# Patient Record
Sex: Male | Born: 1965 | Race: Black or African American | Hispanic: No | Smoking: Never smoker
Health system: Southern US, Community
[De-identification: ages and names within clinical notes are randomized; demographics above are authoritative.]

## PROBLEM LIST (undated history)

## (undated) DIAGNOSIS — F4310 Post-traumatic stress disorder, unspecified: Secondary | ICD-10-CM

## (undated) DIAGNOSIS — I10 Essential (primary) hypertension: Secondary | ICD-10-CM

## (undated) DIAGNOSIS — G43909 Migraine, unspecified, not intractable, without status migrainosus: Secondary | ICD-10-CM

## (undated) DIAGNOSIS — G8929 Other chronic pain: Secondary | ICD-10-CM

## (undated) DIAGNOSIS — M549 Dorsalgia, unspecified: Secondary | ICD-10-CM

## (undated) HISTORY — PX: BACK SURGERY: SHX140

---

## 2009-05-22 ENCOUNTER — Ambulatory Visit: Payer: Self-pay | Admitting: Internal Medicine

## 2009-05-22 DIAGNOSIS — M545 Low back pain, unspecified: Secondary | ICD-10-CM | POA: Insufficient documentation

## 2009-05-22 DIAGNOSIS — R519 Headache, unspecified: Secondary | ICD-10-CM | POA: Insufficient documentation

## 2009-05-22 DIAGNOSIS — F329 Major depressive disorder, single episode, unspecified: Secondary | ICD-10-CM | POA: Insufficient documentation

## 2009-05-22 DIAGNOSIS — Z8719 Personal history of other diseases of the digestive system: Secondary | ICD-10-CM | POA: Insufficient documentation

## 2009-05-22 DIAGNOSIS — I10 Essential (primary) hypertension: Secondary | ICD-10-CM | POA: Insufficient documentation

## 2009-05-22 DIAGNOSIS — F3289 Other specified depressive episodes: Secondary | ICD-10-CM | POA: Insufficient documentation

## 2009-05-22 DIAGNOSIS — R51 Headache: Secondary | ICD-10-CM | POA: Insufficient documentation

## 2009-05-22 LAB — CONVERTED CEMR LAB
Basophils Absolute: 0 10*3/uL (ref 0.0–0.1)
Basophils Relative: 0.2 % (ref 0.0–3.0)
Eosinophils Absolute: 0.2 10*3/uL (ref 0.0–0.7)
Eosinophils Relative: 3.9 % (ref 0.0–5.0)
HCT: 41.7 % (ref 39.0–52.0)
Hemoglobin: 13.6 g/dL (ref 13.0–17.0)
Lymphocytes Relative: 48.7 % — ABNORMAL HIGH (ref 12.0–46.0)
Lymphs Abs: 2.1 10*3/uL (ref 0.7–4.0)
MCHC: 32.6 g/dL (ref 30.0–36.0)
MCV: 94 fL (ref 78.0–100.0)
Monocytes Absolute: 0.4 10*3/uL (ref 0.1–1.0)
Monocytes Relative: 10.4 % (ref 3.0–12.0)
Neutro Abs: 1.5 10*3/uL (ref 1.4–7.7)
Neutrophils Relative %: 36.8 % — ABNORMAL LOW (ref 43.0–77.0)
Platelets: 188 10*3/uL (ref 150.0–400.0)
RBC: 4.43 M/uL (ref 4.22–5.81)
RDW: 12.7 % (ref 11.5–14.6)
WBC: 4.2 10*3/uL — ABNORMAL LOW (ref 4.5–10.5)

## 2009-05-25 ENCOUNTER — Encounter: Payer: Self-pay | Admitting: Internal Medicine

## 2009-05-25 ENCOUNTER — Telehealth: Payer: Self-pay | Admitting: Internal Medicine

## 2009-05-26 ENCOUNTER — Encounter (INDEPENDENT_AMBULATORY_CARE_PROVIDER_SITE_OTHER): Payer: Self-pay | Admitting: *Deleted

## 2009-05-27 ENCOUNTER — Telehealth (INDEPENDENT_AMBULATORY_CARE_PROVIDER_SITE_OTHER): Payer: Self-pay | Admitting: *Deleted

## 2009-05-28 ENCOUNTER — Encounter: Payer: Self-pay | Admitting: Physician Assistant

## 2009-05-28 ENCOUNTER — Ambulatory Visit: Payer: Self-pay | Admitting: Gastroenterology

## 2009-05-28 DIAGNOSIS — M1990 Unspecified osteoarthritis, unspecified site: Secondary | ICD-10-CM | POA: Insufficient documentation

## 2009-05-28 DIAGNOSIS — K625 Hemorrhage of anus and rectum: Secondary | ICD-10-CM | POA: Insufficient documentation

## 2009-05-29 ENCOUNTER — Ambulatory Visit: Payer: Self-pay | Admitting: Gastroenterology

## 2009-05-29 LAB — CONVERTED CEMR LAB
ALT: 33 units/L (ref 0–53)
AST: 29 units/L (ref 0–37)
Albumin: 4.4 g/dL (ref 3.5–5.2)
Alkaline Phosphatase: 44 units/L (ref 39–117)
BUN: 6 mg/dL (ref 6–23)
Basophils Absolute: 0 10*3/uL (ref 0.0–0.1)
Basophils Relative: 0.1 % (ref 0.0–3.0)
CO2: 31 meq/L (ref 19–32)
Calcium: 9.2 mg/dL (ref 8.4–10.5)
Chloride: 102 meq/L (ref 96–112)
Creatinine, Ser: 1 mg/dL (ref 0.4–1.5)
Eosinophils Absolute: 0.2 10*3/uL (ref 0.0–0.7)
Eosinophils Relative: 4.3 % (ref 0.0–5.0)
GFR calc non Af Amer: 103.96 mL/min (ref >60)
Glucose, Bld: 93 mg/dL (ref 70–99)
HCT: 42.1 % (ref 39.0–52.0)
Hemoglobin: 14.2 g/dL (ref 13.0–17.0)
Lymphocytes Relative: 42.3 % (ref 12.0–46.0)
Lymphs Abs: 2.1 10*3/uL (ref 0.7–4.0)
MCHC: 33.7 g/dL (ref 30.0–36.0)
MCV: 90.7 fL (ref 78.0–100.0)
Monocytes Absolute: 0.5 10*3/uL (ref 0.1–1.0)
Monocytes Relative: 9.6 % (ref 3.0–12.0)
Neutro Abs: 2.2 10*3/uL (ref 1.4–7.7)
Neutrophils Relative %: 43.7 % (ref 43.0–77.0)
Platelets: 179 10*3/uL (ref 150.0–400.0)
Potassium: 4.1 meq/L (ref 3.5–5.1)
RBC: 4.64 M/uL (ref 4.22–5.81)
RDW: 12.1 % (ref 11.5–14.6)
Sodium: 138 meq/L (ref 135–145)
Total Bilirubin: 1 mg/dL (ref 0.3–1.2)
Total Protein: 7.1 g/dL (ref 6.0–8.3)
WBC: 5 10*3/uL (ref 4.5–10.5)

## 2009-06-01 ENCOUNTER — Telehealth: Payer: Self-pay | Admitting: Gastroenterology

## 2009-06-03 ENCOUNTER — Encounter: Payer: Self-pay | Admitting: Gastroenterology

## 2009-08-18 ENCOUNTER — Ambulatory Visit: Payer: Self-pay | Admitting: Internal Medicine

## 2009-08-18 ENCOUNTER — Telehealth: Payer: Self-pay | Admitting: Gastroenterology

## 2009-08-18 ENCOUNTER — Ambulatory Visit: Payer: Self-pay | Admitting: Physician Assistant

## 2009-08-18 DIAGNOSIS — K648 Other hemorrhoids: Secondary | ICD-10-CM | POA: Insufficient documentation

## 2009-08-18 DIAGNOSIS — Z860100 Personal history of colon polyps, unspecified: Secondary | ICD-10-CM | POA: Insufficient documentation

## 2009-08-18 DIAGNOSIS — K921 Melena: Secondary | ICD-10-CM | POA: Insufficient documentation

## 2009-08-18 LAB — CONVERTED CEMR LAB
Basophils Absolute: 0 10*3/uL (ref 0.0–0.1)
Basophils Relative: 0.7 % (ref 0.0–3.0)
Eosinophils Absolute: 0.1 10*3/uL (ref 0.0–0.7)
Eosinophils Relative: 2.4 % (ref 0.0–5.0)
HCT: 41.3 % (ref 39.0–52.0)
Hemoglobin: 14.2 g/dL (ref 13.0–17.0)
Lymphocytes Relative: 37.2 % (ref 12.0–46.0)
Lymphs Abs: 2 10*3/uL (ref 0.7–4.0)
MCHC: 34.3 g/dL (ref 30.0–36.0)
MCV: 91 fL (ref 78.0–100.0)
Monocytes Absolute: 0.5 10*3/uL (ref 0.1–1.0)
Monocytes Relative: 10 % (ref 3.0–12.0)
Neutro Abs: 2.7 10*3/uL (ref 1.4–7.7)
Neutrophils Relative %: 49.7 % (ref 43.0–77.0)
Platelets: 207 10*3/uL (ref 150.0–400.0)
RBC: 4.54 M/uL (ref 4.22–5.81)
RDW: 13.3 % (ref 11.5–14.6)
WBC: 5.5 10*3/uL (ref 4.5–10.5)

## 2009-08-20 ENCOUNTER — Telehealth: Payer: Self-pay | Admitting: Internal Medicine

## 2010-01-19 ENCOUNTER — Telehealth (INDEPENDENT_AMBULATORY_CARE_PROVIDER_SITE_OTHER): Payer: Self-pay | Admitting: *Deleted

## 2010-02-26 ENCOUNTER — Emergency Department (HOSPITAL_COMMUNITY)
Admission: EM | Admit: 2010-02-26 | Discharge: 2010-02-26 | Payer: Self-pay | Source: Home / Self Care | Admitting: Emergency Medicine

## 2010-05-18 NOTE — Progress Notes (Signed)
Summary: triage  Phone Note From Other Clinic Call back at [phone]   Caller: Huntley Dec from Dr Jacinto Halim office Call For: Dr.Jacobs Reason for Call: Schedule Patient Appt Summary of Call: Dr Derrell Lolling would like this patient seen before Tues do to worsening rectal bleeding with clots and unknown source. Initial call taken by: Tawni Levy,  May 27, 2009 3:00 PM  Follow-up for Phone Call        pt scheduled with Amy for 05/28/09.  Huntley Dec with Dr Derrell Lolling will make pt aware. Follow-up by: Chales Abrahams CMA Duncan Dull),  May 27, 2009 3:39 PM

## 2010-05-18 NOTE — Progress Notes (Signed)
Summary: rectal bleeding.  Phone Note Call from Patient   Caller: Patient Call For: Gordy Savers  MD Summary of Call: [phone] Bleeding has increased to a flow x 24 hours when he is moving bowels.  Wants surgical referral today if possible. Initial call taken by: Lynann Beaver CMA,  May 25, 2009 8:10 AM  Follow-up for Phone Call        YES Follow-up by: Gordy Savers  MD,  May 25, 2009 8:32 AM  Additional Follow-up for Phone Call Additional follow up Details #1::        Spoke to Camelia Eng, and she is calling [facility], and will notify pt. Additional Follow-up by: Lynann Beaver CMA,  May 25, 2009 8:36 AM

## 2010-05-18 NOTE — Letter (Signed)
Summary: Out of Work  [facility]  [address] [place], [place] [address]   Phone: [phone]  Fax: [phone]    May 28, 2009   Employee:  MAGNUS CRESCENZO    To Whom It May Concern:   For Medical reasons, please excuse the above named employee from work for the following dates:  Start:   05-27-09   End:   Including 05-29-09 Patient having procedure on 05-29-09.  If you need additional information, please feel free to contact our office.         Sincerely,

## 2010-05-18 NOTE — Consult Note (Signed)
Summary: [facility]   Imported By: Maryln Gottron 06/10/2009 09:59:22  _____________________________________________________________________  External Attachment:    Type:   Image     Comment:   External Document

## 2010-05-18 NOTE — Progress Notes (Signed)
Summary: TRIAGE-Rectal Bleeding  Phone Note Call from Patient Call back at Home Phone [phone]   Caller: Patient Call For: Dr. Arlyce Dice Reason for Call: Talk to Nurse Summary of Call: pt would like for Dr. Arlyce Dice and his nurse to know that he is "still bleeding" rectally Initial call taken by: Vallarie Mare,  June 01, 2009 10:14 AM  Follow-up for Phone Call        Pt. had Colon on 05-29-09.  Continues with rectal bleeding, had 1 BM yesterday and passed blood, last night he only passed blood. Also c/o of a "raw"rectum. Pt. is in [place] wants script called to [phone].  1) Anusol HC supp. 1 pr BID x 7 days,sitz bath TID,Tucks pads to rectum TID,use baby wipes instead of toilet paper.  2) If symptoms become worse call back immediately or go to ER.   Follow-up by: Laureen Ochs LPN,  June 01, 2009 11:40 AM  Additional Follow-up for Phone Call Additional follow up Details #1::        ok.  He had a small rectal polyp removed.  Bleeding could be from that or hemorrhoids.  I doubt it's significant Additional Follow-up by: Louis Meckel MD,  June 02, 2009 8:35 AM

## 2010-05-18 NOTE — Progress Notes (Signed)
Summary: hemorrhoid pain & blood clots & dripping with gas pass  Phone Note Call from Patient Call back at [facility] Phone [phone]   Summary of Call: Still having same blood clots in stool.  Hemorrhoid injection by Dr. Derrell Lolling.  Don't see Dr. Gerilyn Pilgrim until 2-15 consult.   Not better.  Still have pain & blood clots. Drip a little blood when he passes gas.  [facility]  [facility].   Initial call taken by: Rudy Jew, RN,  May 27, 2009 10:26 AM  Follow-up for Phone Call        generic vicodin 5/500  #50 one every 6 hours for pain; f/u Dr Derrell Lolling Follow-up by: Gordy Savers  MD,  May 27, 2009 1:10 PM  Additional Follow-up for Phone Call Additional follow up Details #1::        Phone Call Completed Additional Follow-up by: Rudy Jew, RN,  May 27, 2009 1:27 PM    New/Updated Medications: VICODIN 5-500 MG TABS (HYDROCODONE-ACETAMINOPHEN) One every 6 hours for pain Prescriptions: VICODIN 5-500 MG TABS (HYDROCODONE-ACETAMINOPHEN) One every 6 hours for pain  #50 x 0   Entered by:   Rudy Jew, RN   Authorized by:   Gordy Savers  MD   Signed by:   Rudy Jew, RN on 05/27/2009   Method used:   Telephoned to ...         RxID:   [id number]

## 2010-05-18 NOTE — Procedures (Signed)
Summary: Colonoscopy  Patient: Alex [name] Note: All result statuses are Final unless otherwise noted.  Tests: (1) Colonoscopy (COL)   COL Colonoscopy           DONE     [facility]     [address]     [place], [place]  [address]           COLONOSCOPY PROCEDURE REPORT           PATIENT:  [name], Alex  MR#:  [id number]     BIRTHDATE:  09-27-1965, 43 yrs. old  GENDER:  male           ENDOSCOPIST:  Barbette Hair. Arlyce Dice, MD     Referred by:           PROCEDURE DATE:  05/29/2009     PROCEDURE:  Colon with cold biopsy polypectomy     ASA CLASS:  Class I     INDICATIONS:  rectal bleeding           MEDICATIONS:   Fentanyl 100 mcg IV, Versed 10 mg IV, Benadryl 12.5     mg IV           DESCRIPTION OF PROCEDURE:   After the risks benefits and     alternatives of the procedure were thoroughly explained, informed     consent was obtained.  Digital rectal exam was performed and     revealed no abnormalities.   The LB CF-[id number] endoscope     was introduced through the anus and advanced to the cecum, which     was identified by both the appendix and ileocecal valve, without     limitations.  The quality of the prep was good, using MoviPrep.     The instrument was then slowly withdrawn as the colon was fully     examined.     <<PROCEDUREIMAGES>>           FINDINGS:  A sessile polyp was found in the rectum. It was 2 mm in     size. It was found 5 cm from the point of entry. The polyp was     removed using cold biopsy forceps (see image10).  Internal     hemorrhoids were found (see image12).  This was otherwise a normal     examination of the colon (see image1, image3, image5, image7, and     image9).   Retroflexed views in the rectum revealed no     abnormalities.    The scope was then withdrawn from the patient     and the procedure completed.           COMPLICATIONS:  None           ENDOSCOPIC IMPRESSION:     1) 2 mm sessile polyp in the rectum     2) Internal  hemorrhoids     3) Otherwise normal examination           Limited rectal bleeding secondarhy to hemorrhoids           RECOMMENDATIONS:     1) If the polyp(s) removed today are proven to be adenomatous     (pre-cancerous) polyps, you will need a repeat colonoscopy in 5     years. Otherwise you should continue to follow colorectal cancer     screening guidelines for "routine risk" patients with colonoscopy     in 10 years.     2) anusol supp prn  REPEAT EXAM:   You will receive a letter from Dr. Arlyce Dice in 1-2     weeks, after reviewing the final pathology, with followup     recommendations.           ______________________________     Barbette Hair Arlyce Dice, MD           CC:  Claud Kelp, MDPeter Lysle Dingwall, MD           n.     Rosalie Doctor:   Barbette Hair. Domonic Hiscox at 05/29/2009 02:34 PM           [name], [id number]  Note: An exclamation mark (!) indicates a result that was not dispersed into the flowsheet. Document Creation Date: 05/29/2009 2:34 PM _______________________________________________________________________  (1) Order result status: Final Collection or observation date-time: 05/29/2009 14:28 Requested date-time:  Receipt date-time:  Reported date-time:  Referring Physician:   Ordering Physician: Melvia Heaps [phone]) Specimen Source:  Source: Launa Grill Order Number: [id number] Lab site:   Appended Document: Colonoscopy     Procedures Next Due Date:    Colonoscopy: 05/2019

## 2010-05-18 NOTE — Progress Notes (Signed)
Summary: TRIAGE--Black stool, weakness  Phone Note Call from Patient Call back at Home Phone [phone]   Caller: Patient Call For: Arlyce Dice Reason for Call: Talk to Nurse Summary of Call: Patient states that he has bright red blood in his stools and his stools are black, states that symptoms started today. Initial call taken by: Tawni Levy,  Aug 18, 2009 2:17 PM  Follow-up for Phone Call        Last OV 05-28-09 and Colon 05-29-09.  For 2-3 days pt. has seeingBRB in stools. This afternoon his BM was "Totally black"  Denies abd. pain, fever, n/v.  "I just feel like crap. I feel weak, different, I am not my bright self."  Pt. will have a STAT CBCD and see Amy [facility] PAC at 3pm today. Follow-up by: Laureen Ochs LPN,  Aug 18, 1025 2:26 PM

## 2010-05-18 NOTE — Letter (Signed)
Summary: Out of Work  [facility]  [address]   [place], [place] [address]   Phone: [phone]  Fax: [phone]    May 22, 2009   Employee:  NIRVAN LABAN    To Whom It May Concern:   For Medical reasons, please excuse the above named employee from work for the following dates:  Start:   05-22-2009  End:   05-25-2009  If you need additional information, please feel free to contact our office.         Sincerely,    Gordy Savers  MD

## 2010-05-18 NOTE — Progress Notes (Signed)
  Phone Note Other Incoming   Request: Send information Summary of Call: Request for records received from [facility]. Request forwarded to [employer].      Appended Document:  Request for records received from [facility]. Request forwarded to [employer].

## 2010-05-18 NOTE — Letter (Signed)
Summary: [facility] Instructions  [facility]  [address] [place], [place] [address]   Phone: [phone]  Fax: [phone]       Alex [name]    05-Dec-1965    MRN: [id number]        Procedure Day /Date:05-29-09     Arrival Time: 12:30 PM     Procedure Time: 1:30 PM     Location of Procedure:                    X    [facility] (4th Floor) _ PREPARATION FOR COLONOSCOPY WITH MOVIPREP    THE DAY BEFORE YOUR PROCEDURE         DATE: 05-28-09  DAY: Thursday  1.  Drink clear liquids the entire day-NO SOLID FOOD  2.  Do not drink anything colored red or purple.  Avoid juices with pulp.  No orange juice.  3.  Drink at least 64 oz. (8 glasses) of fluid/clear liquids during the day to prevent dehydration and help the prep work efficiently.  CLEAR LIQUIDS INCLUDE: Water Jello Ice Popsicles Tea (sugar ok, no milk/cream) Powdered fruit flavored drinks Coffee (sugar ok, no milk/cream) Gatorade Juice: apple, white grape, white cranberry  Lemonade Clear bullion, consomm, broth Carbonated beverages (any kind) Strained chicken noodle soup Hard Candy                             4.  In the morning, mix first dose of MoviPrep solution:    Empty 1 Pouch A and 1 Pouch B into the disposable container    Add lukewarm drinking water to the top line of the container. Mix to dissolve    Refrigerate (mixed solution should be used within 24 hrs)  5.  Begin drinking the prep at 5:00 p.m. The MoviPrep container is divided by 4 marks.   Every 15 minutes drink the solution down to the next mark (approximately 8 oz) until the full liter is complete.   6.  Follow completed prep with 16 oz of clear liquid of your choice (Nothing red or purple).  Continue to drink clear liquids until bedtime.  7.  Before going to bed, mix second dose of MoviPrep solution:    Empty 1 Pouch A and 1 Pouch B into the disposable container    Add lukewarm drinking water to the top line  of the container. Mix to dissolve    Refrigerate  THE DAY OF YOUR PROCEDURE      DATE: 05-29-09 DAY: Friday  Beginning at 8:30 AM  (5 hours before procedure):         1. Every 15 minutes, drink the solution down to the next mark (approx 8 oz) until the full liter is complete.  2. Follow completed prep with 16 oz. of clear liquid of your choice.    3. You may drink clear liquids until 9:30 AM (2 HOURS BEFORE PROCEDURE).   MEDICATION INSTRUCTIONS  Unless otherwise instructed, you should take regular prescription medications with a small sip of water   as early as possible the morning of your procedure.      OTHER INSTRUCTIONS  You will need a responsible adult at least 46 years of age to accompany you and drive you home.   This person must remain in the waiting room during your procedure.  Wear loose fitting clothing that is easily removed.  Leave jewelry and other valuables  at home.  However, you may wish to bring a book to read or  an iPod/MP3 player to listen to music as you wait for your procedure to start.  Remove all body piercing jewelry and leave at home.  Total time from sign-in until discharge is approximately 2-3 hours.  You should go home directly after your procedure and rest.  You can resume normal activities the  day after your procedure.  The day of your procedure you should not:   Drive   Make legal decisions   Operate machinery   Drink alcohol   Return to work  You will receive specific instructions about eating, activities and medications before you leave.    The above instructions have been reviewed and explained to me by   _______________________    I fully understand and can verbalize these instructions _____________________________ Date _________

## 2010-05-18 NOTE — Assessment & Plan Note (Signed)
Summary: BRAND NEW PT/TO ESTABLISH/CJR   Vital Signs:  Patient profile:   45 year old male Weight:      174 pounds Temp:     98.8 degrees F oral Pulse rate:   64 / minute BP sitting:   110 / 80  (right arm) Cuff size:   regular  Vitals Entered By: Raechel Ache, RN (May 22, 2009 1:31 PM) CC: New pt - bloos in stool x 2 mos , with every stool last 2 weeks   CC:  New pt - bloos in stool x 2 mos  and with every stool last 2 weeks.  History of Present Illness: 45 year old patient who is in today to establish with our practice.  In the past two months.  He has had intermittent bright red rectal bleeding.  For the past week or two.  This has intensified.  He was seen in the ED two days ago and apparently hemoglobin was fairly normal and clinical exam was nonrevealing.  Denies any rectal pain with bowel movements themselves have been normal.  Stool is well formed and normal in appearance.  No personal or family history of inflammatory bowel disease.  Preventive Screening-Counseling & Management  Alcohol-Tobacco     Smoking Status: never  Caffeine-Diet-Exercise     Does Patient Exercise: yes  Allergies (verified): No Known Drug Allergies  Past History:  Past Medical History: Depression/PTSD Headache Hypertension Low back pain- L1-2, and 3.  Fracture 1987 bright red rectal bleeding  Past Surgical History: Lumbar laminectomy in 1999  Family History: Reviewed history and no changes required. father's health unknown mother history of hypertension, and depression paternal grandfather history of throat cancer positive for diabetes and lupus  Social History: Reviewed history and no changes required. medical discharge from the [employer]	[place], 1987 Never Smoked Alcohol use-yes Regular exercise-yes Smoking Status:  never Does Patient Exercise:  yes  Review of Systems       The patient complains of anorexia, hematochezia, and depression.  The patient denies fever,  weight loss, weight gain, vision loss, decreased hearing, hoarseness, chest pain, syncope, dyspnea on exertion, peripheral edema, prolonged cough, headaches, hemoptysis, abdominal pain, melena, severe indigestion/heartburn, hematuria, incontinence, genital sores, muscle weakness, suspicious skin lesions, transient blindness, difficulty walking, unusual weight change, abnormal bleeding, enlarged lymph nodes, angioedema, breast masses, and testicular masses.    Physical Exam  General:  Well-developed,well-nourished,in no acute distress; alert,appropriate and cooperative throughout examination Head:  Normocephalic and atraumatic without obvious abnormalities. No apparent alopecia or balding. Eyes:  No corneal or conjunctival inflammation noted. EOMI. Perrla. Funduscopic exam benign, without hemorrhages, exudates or papilledema. Vision grossly normal. Ears:  External ear exam shows no significant lesions or deformities.  Otoscopic examination reveals clear canals, tympanic membranes are intact bilaterally without bulging, retraction, inflammation or discharge. Hearing is grossly normal bilaterally. Mouth:  Oral mucosa and oropharynx without lesions or exudates.  Teeth in good repair. Neck:  No deformities, masses, or tenderness noted. Chest Wall:  No deformities, masses, tenderness or gynecomastia noted. Breasts:  No masses or gynecomastia noted Lungs:  Normal respiratory effort, chest expands symmetrically. Lungs are clear to auscultation, no crackles or wheezes. Heart:  Normal rate and regular rhythm. S1 and S2 normal without gallop, murmur, click, rub or other extra sounds. Abdomen:  Bowel sounds positive,abdomen soft and non-tender without masses, organomegaly or hernias noted. Rectal:  No external abnormalities noted. Normal sphincter tone. No rectal masses or tenderness. stool light Amacher hematest negative  Genitalia:  Testes  bilaterally descended without nodularity, tenderness or masses. No  scrotal masses or lesions. No penis lesions or urethral discharge. Prostate:  Prostate gland firm and smooth, no enlargement, nodularity, tenderness, mass, asymmetry or induration. Msk:  No deformity or scoliosis noted of thoracic or lumbar spine.   Pulses:  R and L carotid,radial,femoral,dorsalis pedis and posterior tibial pulses are full and equal bilaterally Extremities:  No clubbing, cyanosis, edema, or deformity noted with normal full range of motion of all joints.   Neurologic:  No cranial nerve deficits noted. Station and gait are normal. Plantar reflexes are down-going bilaterally. DTRs are symmetrical throughout. Sensory, motor and coordinative functions appear intact. Skin:  Intact without suspicious lesions or rashes Cervical Nodes:  No lymphadenopathy noted Axillary Nodes:  No palpable lymphadenopathy Inguinal Nodes:  No significant adenopathy Psych:  Cognition and judgment appear intact. Alert and cooperative with normal attention span and concentration. No apparent delusions, illusions, hallucinations   Impression & Recommendations:  Problem # 1:  LOW BACK PAIN (ICD-724.2)  His updated medication list for this problem includes:    Ibuprofen 800 Mg Tabs (Ibuprofen) ..... Qd    Diclofenac Sodium 25 Mg Tbec (Diclofenac sodium) .[name]... Prn    Amrix 15 Mg Xr24h-cap (Cyclobenzaprine hcl) .[name]... Currently not using  Problem # 2:  HYPERTENSION (ICD-401.9)  His updated medication list for this problem includes:    Lisinopril 5 Mg Tabs (Lisinopril) ..... One daily  Problem # 3:  DEPRESSION (ICD-311)  His updated medication list for this problem includes:    Venlafaxine Hcl 150 Mg Xr24h-cap (Venlafaxine hcl) ..... Qd  Problem # 4:  RECTAL BLEEDING, HX OF (ICD-V12.79)  Orders: Venipuncture (45409) TLB-CBC Platelet - w/Differential (85025-CBCD) Surgical Referral (Surgery)  Complete Medication List: 1)  Ibuprofen 800 Mg Tabs (Ibuprofen) .... Qd 2)  Sumatriptan Succinate 6  Mg/0.29ml Kit (Sumatriptan succinate) .... As needed 3)  Venlafaxine Hcl 150 Mg Xr24h-cap (Venlafaxine hcl) .... Qd 4)  Ambien 5 Mg Tabs (Zolpidem tartrate) .... Mon - fri 5)  Diclofenac Sodium 25 Mg Tbec (Diclofenac sodium) .... Prn 6)  Amrix 15 Mg Xr24h-cap (Cyclobenzaprine hcl) .... Currently not using 7)  Lisinopril 5 Mg Tabs (Lisinopril) .... One daily  Patient Instructions: 1)  general surgical follow-up as scheduled 2)  Please schedule a follow-up appointment in 3 months. 3)  It is important that you exercise regularly at least 20 minutes 5 times a week. If you develop chest pain, have severe difficulty breathing, or feel very tired , stop exercising immediately and seek medical attention. Prescriptions: LISINOPRIL 5 MG TABS (LISINOPRIL) one daily  #90 x 4   Entered and Authorized by:   Gordy Savers  MD   Signed by:   Gordy Savers  MD on 05/22/2009   Method used:   Print then Give to Patient   RxID:   [id number] AMBIEN 5 MG TABS (ZOLPIDEM TARTRATE) mon - fri  #50 x 2   Entered and Authorized by:   Gordy Savers  MD   Signed by:   Gordy Savers  MD on 05/22/2009   Method used:   Print then Give to Patient   RxID:   [id number] VENLAFAXINE HCL 150 MG XR24H-CAP (VENLAFAXINE HCL) qd  #180 x 6   Entered and Authorized by:   Gordy Savers  MD   Signed by:   Gordy Savers  MD on 05/22/2009   Method used:   Print then Give to Patient   RxID:   [id number]  SUMATRIPTAN SUCCINATE 6 MG/0.5ML KIT (SUMATRIPTAN SUCCINATE) as needed  #3 x 3   Entered and Authorized by:   Gordy Savers  MD   Signed by:   Gordy Savers  MD on 05/22/2009   Method used:   Print then Give to Patient   RxID:   [id number]

## 2010-05-18 NOTE — Letter (Signed)
Summary: Out of Work  [facility]  [address]   [place], [place] [address]   Phone: [phone]  Fax: [phone]    May 22, 2009   Employee:  Alex [name]    To Whom It May Concern:   For Medical reasons, please excuse the above named employee from work for the following dates:  Start:   05-22-2009  End:   05-25-2009  If you need additional information, please feel free to contact our office.         Sincerely,    [name] [name]  MD 

## 2010-05-18 NOTE — Progress Notes (Signed)
Summary: [facility]  called pt - [facility] - forgot appt today - transffered to scheduling to r/s   [facility]

## 2010-05-18 NOTE — Letter (Signed)
Summary: Patient Notice-Hyperplastic Polyps  [facility]  [address] [place], [place] [address]   Phone: [phone]  Fax: [phone]        June 03, 2009 MRN: [id number]    Alex [name] [address] [place], [place]  [address]    Dear [name],  I am pleased to inform you that the colon polyp(s) removed during your recent colonoscopy was (were) found to be hyperplastic.  These types of polyps are NOT pre-cancerous.  It is therefore my recommendation that you have a repeat colonoscopy examination in 10_ years for routine colorectal cancer screening.  Should you develop new or worsening symptoms of abdominal pain, bowel habit changes or bleeding from the rectum or bowels, please schedule an evaluation with either your primary care physician or with me.  Additional information/recommendations:  __No further action with gastroenterology is needed at this time.      Please follow-up with your primary care physician for your other      healthcare needs. __Please call [phone] to schedule a return visit to review      your situation.  __Please keep your follow-up visit as already scheduled.  _x_Continue treatment plan as outlined the day of your exam.  Please call us if you are having persistent problems or have questions about your condition that have not been fully answered at this time.  Sincerely,  Louis Meckel MD This letter has been electronically signed by your physician.  Appended Document: Patient Notice-Hyperplastic Polyps Letter mailed 2.17.11

## 2010-05-18 NOTE — Assessment & Plan Note (Signed)
Summary: Alex [name], Alex [name], Alex [name]   (DR.KAPLAN PT.)   Alex [name]   History of Present Illness Visit Type: Follow-up Visit Primary GI MD: Melvia Heaps MD [facility] Primary Provider: Eleonore Chiquito, MD Requesting Provider: na Chief Complaint: Patient having blood in his [name] for the past couple of days. He states that the [name] is Alex, he denies any abdominal pain but states that he has been under alot of stress the last week or so.  History of Present Illness:   PLEASANT 45 Y.O MALE KNOWN TO DR. KAPLAN. HE HAS HX OF INTERMITTENT RECTAL BLEEDING AND UNDERWENT COLONOSCOPY ON 211/11. THIS SHOWED A SMALL SESSILE RECTAL POLYP,AND INTERNAL HEMORRHOIDS.Marland KitchenHE HAS BEEN USING ANUSOL SUPP ,AND ANALPRAM AS NEEDED BUT STILL IS BOTHERED BY BRB OFF AND ON. HE COMES IN TODAY BECAUSE HE PASSED A Alex [name] X 2 TODAY. [name] ALSO HAD SOME DARK RED BLOOD ON IT. HE HAS NO C/O ABDOMINAL DISCOMFROT, RECTAL DISCOMFORT. HE HAS BEEN ON IBUPROFEN 8OO MG DAILY FOR YEARS, HAD NOT USED ANY PEPTO ETC. THE Alex [name] ALARMED HIM.   GI Review of Systems      Denies abdominal pain, acid reflux, belching, bloating, chest pain, dysphagia with liquids, dysphagia with solids, heartburn, loss of appetite, nausea, vomiting, and  weight loss.      Reports Alex tarry stools, hemorrhoids, and  rectal bleeding.     Denies anal fissure, change in bowel habit, constipation, diarrhea, diverticulosis, fecal incontinence, heme positive [name], irritable bowel syndrome, jaundice, light color [name], liver problems, and  rectal pain. Preventive Screening-Counseling & Management      Drug Use:  no.      Current Medications (verified): 1)  Ibuprofen 800 Mg Tabs (Ibuprofen) .... Take One By Mouth Once Daily 2)  Sumatriptan Succinate 6 Mg/0.73ml Kit (Sumatriptan Succinate) .... As Needed 3)  Venlafaxine Hcl 150 Mg Xr24h-Cap (Venlafaxine Hcl) .... Qd 4)  Ambien 5 Mg Tabs (Zolpidem Tartrate) .... Mon - Fri 5)  Diclofenac Sodium 25 Mg Tbec  (Diclofenac Sodium) .... Prn 6)  Lisinopril 5 Mg Tabs (Lisinopril) .... One Daily  Allergies (verified): No Known Drug Allergies  Past History:  Past Medical History: Depression/PTSD Headache Hypertension Low back pain- L1-2, and 3.  Fracture 1987 hyperplastic polyp INTERNAL HEMORRHOIDS  Past Surgical History: Reviewed history from 05/22/2009 and no changes required. Lumbar laminectomy in 1999  Family History: father's health unknown mother history of hypertension, and depression paternal grandfather history of throat cancer positive for diabetes and lupus: maternal grandmother No FH of Colon Cancer:  Social History: medical discharge from the [employer]	[place], 1987 Never Smoked Alcohol use-yes Regular exercise-yes Daily Caffeine Use Illicit Drug Use - no Drug Use:  no  Review of Systems       The patient complains of anxiety-new, arthritis/joint pain, back pain, change in vision, depression-new, Alex [name], headaches-new, muscle pains/cramps, and sleeping problems.  The patient denies allergy/sinus, anemia, blood in urine, breast changes/lumps, confusion, cough, coughing up blood, fainting, fever, hearing problems, heart murmur, heart rhythm changes, itching, menstrual pain, night sweats, nosebleeds, pregnancy symptoms, shortness of breath, skin rash, sore throat, swelling of feet/legs, swollen lymph glands, thirst - excessive , urination - excessive , urination changes/pain, urine leakage, vision changes, and voice change.         ROS OTHERWISE AS IN HPI  Vital Signs:  Patient profile:   45 year old male Height:      67 inches Weight:      175.6 pounds BMI:  27.60 Pulse rate:   70 / minute Pulse rhythm:   regular BP sitting:   138 / 80  (left arm) Cuff size:   regular  Vitals Entered By: Harlow Mares CMA Duncan Dull) (Aug 18, 2009 3:30 PM)  Physical Exam  General:  Well developed, well nourished, no acute distress. Head:  Normocephalic and atraumatic. Eyes:   PERRLA, no icterus. Lungs:  Clear throughout to auscultation. Heart:  Regular rate and rhythm; no murmurs, rubs,  or bruits. Abdomen:  SOFT, NONTENDER, NO MASS OR HSM,BS+ Rectal:  DARK Seth SCANT [name],HEME POSITIVE, NO EXTERNAL HEMORRHOIDS Extremities:  No clubbing, cyanosis, edema or deformities noted. Neurologic:  Alert and  oriented x4;  grossly normal neurologically. Psych:  Alert and cooperative. Normal mood and affect.   Impression & Recommendations:  Problem # 1:  BLOOD IN [name]-MELENA (ICD-578.1) Assessment New 44 YO MALE WITH KNOWN INTERMITTENT RECTAL BLEEDING FROM INTERNAL HEMORRHOIDS, NOW WITH C/O MELENA. HE HAS HEME POSITIVE [name] ON EXAM BUT NOT MELENA.  HE DOES HAVE INCREASED RISK FOR UPPER GI IRRITATION SECONDARY TO CHRONIC NSAID USE. LABS IN OFFICE TODAY SHOW NORMAL HGB OF 14.2.  REASSURANCE. WILL STOP IBUPROFEN START NEXIUM 40 MG DAILY X ONE MONTH(SAMPLES GIVEN) PT ADVISED TO CALL IF HE HAS ANY FURTHER Alex STOOLS -AND AT THAT POINT WOULD PURSUE EGD .HE WILL CALL us ON 5/5 WITH AN UPDATE.  Problem # 2:  PERSONAL HX COLONIC POLYPS (ICD-V12.72) Assessment: Comment Only HYPERPLASTIC. COLONOSCOPY 2/11  Problem # 3:  HEMORRHOIDS-INTERNAL (ICD-455.0) Assessment: Comment Only UNCHANGED  Patient Instructions: 1)  We have given you samples of Nexium. Take 1 capsule 30 min before breakfast. 2)  Call Pam on Thurs 08-20-09 at [phone] with an update. 3)  Any more Alex stools and you may need an Endoscopy. 4)  Copy sent to : Eleonore Chiquito, MD 5)  The medication list was reviewed and reconciled.  All changed / newly prescribed medications were explained.  A complete medication list was provided to the patient / caregiver.

## 2010-05-18 NOTE — Letter (Signed)
Summary: New Patient letter  [facility]  [address] [place], [place] [address]   Phone: [phone]  Fax: [phone]       05/26/2009 MRN: [id number]  Alex [name] [address] [place], [place]  [address]  Dear Mr. WIMER,  Welcome to the Gastroenterology Division at [facility].    You are scheduled to see Dr.  Christella Hartigan on 06-02-09 at 2pm on the 3rd floor at [facility], [address]  We ask that you try to arrive at our office 15 minutes prior to your appointment time to allow for check-in.  We would like you to complete the enclosed self-administered evaluation form prior to your visit and bring it with you on the day of your appointment.  We will review it with you.  Also, please bring a complete list of all your medications or, if you prefer, bring the medication bottles and we will list them.  Please bring your insurance card so that we may make a copy of it.  If your insurance requires a referral to see a specialist, please bring your referral form from your primary care physician.  Co-payments are due at the time of your visit and may be paid by cash, check or credit card.     Your office visit will consist of a consult with your physician (includes a physical exam), any laboratory testing he/she may order, scheduling of any necessary diagnostic testing (e.g. x-ray, ultrasound, CT-scan), and scheduling of a procedure (e.g. Endoscopy, Colonoscopy) if required.  Please allow enough time on your schedule to allow for any/all of these possibilities.    If you cannot keep your appointment, please call [phone] to cancel or reschedule prior to your appointment date.  This allows [place] the opportunity to schedule an appointment for another patient in need of care.  If you do not cancel or reschedule by 5 p.m. the business day prior to your appointment date, you will be charged a $50.00 late cancellation/no-show fee.    Thank you for choosing [facility] for your medical needs.  We appreciate the opportunity to care for you.  Please visit [place] at our website  to learn more about our practice.                     Sincerely,                                                             The Gastroenterology Division

## 2010-05-18 NOTE — Assessment & Plan Note (Signed)
Summary: rectal bleeding with clots/pl   History of Present Illness Visit Type: Initial Consult Primary GI MD: Melvia Heaps MD [facility] Primary Provider: Eleonore Chiquito, MD Requesting Provider: Claud Kelp, MD Chief Complaint: BRB with BM starting 2 months ago off and on bu more recently he has seen BRB with darker blood clots with BM's every day. Pt hasn't noticed a significant change in bowels besides his stool is more formed. No abd pain.  History of Present Illness:   45 YO MALE NEW TO GI TODAY REFERRED BY DR Alex [name] FOR COLON EVALUATION. HE WAS SEEN BBY DR Alex [name] EARLIER IN THE WEEK FOR RECTAL BLEEDING,PER PTS REPORT HAD INJECTION OF SMALL INTERNAL HEMORRHOID BUT DR. INGRAM WAS NOT CONVINCED THIS WAS THE SOURCE OF HIS BLEEDING. HE HAS BEEN HAVING SOME INTERMITTENT RECTAL BLEEDING OVER THE  PAST COUPLE MONTHS,BUT THE PAST 2 WEEKS HAS BEEN SEEING MORE BLOOD AND SOME CLOTS.HE HAS NO RECTAL DISCOMFORT PRESSURE ETC. HE HAS NOT HAD ANY ABDOMINAL PAIN.[name] APPETITE FAIR,WEIGHT DOWN 5 POUNDS THE PAST MONTH,NOT SLEEPING WELL. [name] HE C/O FATIGUE. HE IS NOW USING ANUSOL SUPP, AND IS DOING SITZ BATHS WITH EPSOM SALTS AND SAW LESS BLOOD TODAY.   GI Review of Systems      Denies abdominal pain, acid reflux, belching, bloating, chest pain, dysphagia with liquids, dysphagia with solids, heartburn, loss of appetite, nausea, vomiting, vomiting blood, weight loss, and  weight gain.      Reports hemorrhoids and  rectal bleeding.     Denies anal fissure, black tarry stools, change in bowel habit, constipation, diarrhea, diverticulosis, fecal incontinence, heme positive stool, irritable bowel syndrome, jaundice, light color stool, liver problems, and  rectal pain.      ------------------------------------------------------------------------------------------------------------------------------------------------------    Current Medications (verified): 1)  Ibuprofen 800 Mg Tabs (Ibuprofen) .... Qd 2)   Sumatriptan Succinate 6 Mg/0.26ml Kit (Sumatriptan Succinate) .... As Needed 3)  Venlafaxine Hcl 150 Mg Xr24h-Cap (Venlafaxine Hcl) .... Qd 4)  Ambien 5 Mg Tabs (Zolpidem Tartrate) .... Mon - Fri 5)  Diclofenac Sodium 25 Mg Tbec (Diclofenac Sodium) .... Prn 6)  Amrix 15 Mg Xr24h-Cap (Cyclobenzaprine Hcl) .... Currently Not Using 7)  Lisinopril 5 Mg Tabs (Lisinopril) .... One Daily 8)  Vicodin 5-500 Mg Tabs (Hydrocodone-Acetaminophen) .... One Every 6 Hours For Pain  Allergies (verified): No Known Drug Allergies  Past History:  Past Medical History: Depression/PTSD Headache Hypertension Low back pain- L1-2, and 3.  Fracture [id number]  Past Surgical History: Reviewed history from 05/22/2009 and no changes required. Lumbar laminectomy in 1999  Family History: Reviewed history from 05/22/2009 and no changes required. father's health unknown mother history of hypertension, and depression paternal grandfather history of throat cancer positive for diabetes and lupus  Social History: Reviewed history from 05/22/2009 and no changes required. medical discharge from the [employer]	[place], 1987 Never Smoked Alcohol use-yes Regular exercise-yes  Review of Systems       The patient complains of fatigue.  The patient denies allergy/sinus, anemia, anxiety-new, arthritis/joint pain, back pain, blood in urine, breast changes/lumps, change in vision, confusion, cough, coughing up blood, depression-new, fainting, fever, headaches-new, hearing problems, heart murmur, heart rhythm changes, itching, menstrual pain, muscle pains/cramps, night sweats, nosebleeds, pregnancy symptoms, shortness of breath, skin rash, sleeping problems, sore throat, swelling of feet/legs, swollen lymph glands, thirst - excessive , urination - excessive , urination changes/pain, urine leakage, vision changes, and voice change.         ROS OTHERWISE AS IN HPI  Vital Signs:  Patient  profile:   45 year old male Height:      67  inches Weight:      176 pounds BMI:     27.67 Pulse rate:   70 / minute Pulse rhythm:   regular BP sitting:   134 / 82  (left arm) Cuff size:   regular  Vitals Entered By: Christie Nottingham CMA Duncan Dull) (May 28, 2009 10:34 AM)  Physical Exam  General:  Well developed, well nourished, no acute distress. Head:  Normocephalic and atraumatic. Eyes:  PERRLA, no icterus. Lungs:  Clear throughout to auscultation. Heart:  Regular rate and rhythm; no murmurs, rubs,  or bruits. Abdomen:  SOFT, NONTENDER, NO MASS OR HSM,BS+ Rectal:  NOT DONE,HAD EXAM PER DR INGRAM EARLIER THIS WEEK. Extremities:  No clubbing, cyanosis, edema or deformities noted. Neurologic:  Alert and  oriented x4;  grossly normal neurologically. Psych:  Alert and cooperative. Normal mood and affect.   Impression & Recommendations:  Problem # 1:  RECTAL BLEEDING (ICD-569.3) Assessment New 45 YO MALE WITH BRB PER RECTUM  X 2-3 WEEKS,EXAM PER SURGEON DR INGRAM  EARLIER THIS WEEK WITH INJECTION OF SMALL INTERNAL HEMORRHOID,UNCLEAR IF HEMORRHOID IS SOURCE OF BLEEDING. R/O OCCULT LESION.  LABS TODAY AS BELOW CONTINUE SITZ BATHS AND ANUSOL HC SUPP.[facility] SCHEDULE FOR COLONOSCOPY WITH DR Arlyce Dice ,PROCEDURE DISCUSSED IN DETAIL WITH PT.  Problem # 2:  HYPERTENSION (ICD-401.9) Assessment: Comment Only  Problem # 3:  LOW BACK PAIN (ICD-724.2) Assessment: Comment Only CHRONIC  Problem # 4:  DEPRESSION (ICD-311)/PTSD Assessment: Comment Only  Other Orders: Colonoscopy (Colon) TLB-CMP (Comprehensive Metabolic Pnl) (80053-COMP) TLB-CBC Platelet - w/Differential (85025-CBCD) Prescriptions: MOVIPREP 100 GM  SOLR (PEG-KCL-NACL-NASULF-NA ASC-C) As per prep instructions.  #1 x 0   Entered by:   Lowry Ram NCMA   Authorized by:   Sammuel Cooper PA-c   Signed by:   Lowry Ram NCMA on 05/28/2009   Method used:   Electronically to        [facility] # [phone]* (retail)       [address]       [place], [place]   [address]       Ph: [phone]       Fax: [phone]   RxID:   [id number]

## 2010-05-18 NOTE — Letter (Signed)
Summary: Out of Work  Adult nurse at [employer]  [address]   [place], [place] [address]   Phone: [phone]  Fax: [phone]    May 22, 2009   Employee:  Alex [name]    To Whom It May Concern:   For Medical reasons, please excuse the above named employee from work for the following dates:  Start:    End:    If you need additional information, please feel free to contact our office.         Sincerely,    Gordy Savers  MD

## 2010-06-29 LAB — POCT I-STAT, CHEM 8
BUN: 11 mg/dL (ref 6–23)
Calcium, Ion: 1.18 mmol/L (ref 1.12–1.32)
Chloride: 107 mEq/L (ref 96–112)
Creatinine, Ser: 1.2 mg/dL (ref 0.4–1.5)
Glucose, Bld: 96 mg/dL (ref 70–99)
HCT: 44 % (ref 39.0–52.0)
Hemoglobin: 15 g/dL (ref 13.0–17.0)
Potassium: 3.8 mEq/L (ref 3.5–5.1)
Sodium: 141 mEq/L (ref 135–145)
TCO2: 26 mmol/L (ref 0–100)

## 2013-11-15 ENCOUNTER — Emergency Department (HOSPITAL_BASED_OUTPATIENT_CLINIC_OR_DEPARTMENT_OTHER)
Admission: EM | Admit: 2013-11-15 | Discharge: 2013-11-15 | Disposition: A | Discharge status: Home / Self Care | Payer: Medicare Other | Attending: Emergency Medicine | Admitting: Emergency Medicine

## 2013-11-15 ENCOUNTER — Encounter (HOSPITAL_BASED_OUTPATIENT_CLINIC_OR_DEPARTMENT_OTHER): Payer: Self-pay | Admitting: Emergency Medicine

## 2013-11-15 DIAGNOSIS — J3489 Other specified disorders of nose and nasal sinuses: Secondary | ICD-10-CM | POA: Insufficient documentation

## 2013-11-15 DIAGNOSIS — R509 Fever, unspecified: Secondary | ICD-10-CM | POA: Insufficient documentation

## 2013-11-15 DIAGNOSIS — F4310 Post-traumatic stress disorder, unspecified: Secondary | ICD-10-CM | POA: Diagnosis not present

## 2013-11-15 DIAGNOSIS — H65192 Other acute nonsuppurative otitis media, left ear: Secondary | ICD-10-CM

## 2013-11-15 DIAGNOSIS — H9209 Otalgia, unspecified ear: Secondary | ICD-10-CM | POA: Insufficient documentation

## 2013-11-15 DIAGNOSIS — R05 Cough: Secondary | ICD-10-CM | POA: Insufficient documentation

## 2013-11-15 DIAGNOSIS — J029 Acute pharyngitis, unspecified: Secondary | ICD-10-CM | POA: Diagnosis not present

## 2013-11-15 DIAGNOSIS — R059 Cough, unspecified: Secondary | ICD-10-CM | POA: Insufficient documentation

## 2013-11-15 DIAGNOSIS — N390 Urinary tract infection, site not specified: Secondary | ICD-10-CM | POA: Diagnosis not present

## 2013-11-15 DIAGNOSIS — I10 Essential (primary) hypertension: Secondary | ICD-10-CM | POA: Diagnosis not present

## 2013-11-15 DIAGNOSIS — H65199 Other acute nonsuppurative otitis media, unspecified ear: Secondary | ICD-10-CM | POA: Insufficient documentation

## 2013-11-15 DIAGNOSIS — IMO0002 Reserved for concepts with insufficient information to code with codable children: Secondary | ICD-10-CM | POA: Diagnosis not present

## 2013-11-15 DIAGNOSIS — Z792 Long term (current) use of antibiotics: Secondary | ICD-10-CM | POA: Diagnosis not present

## 2013-11-15 DIAGNOSIS — J069 Acute upper respiratory infection, unspecified: Secondary | ICD-10-CM

## 2013-11-15 DIAGNOSIS — Z79899 Other long term (current) drug therapy: Secondary | ICD-10-CM | POA: Diagnosis not present

## 2013-11-15 HISTORY — DX: Post-traumatic stress disorder, unspecified: F43.10

## 2013-11-15 HISTORY — DX: Essential (primary) hypertension: I10

## 2013-11-15 MED ORDER — FLUTICASONE PROPIONATE 50 MCG/ACT NA SUSP: 2.0000 | Freq: Every day | NASAL | Status: DC

## 2013-11-15 MED ORDER — AMOXICILLIN 500 MG PO CAPS: 500.0000 mg | ORAL_CAPSULE | Freq: Three times a day (TID) | ORAL | Status: DC

## 2013-11-15 NOTE — ED Notes (Signed)
C/o left ear pain and congestion x 10 days

## 2013-11-15 NOTE — ED Provider Notes (Signed)
CSN: [id number]     Arrival date & time 11/15/13  1901 History   First MD Initiated Contact with Patient 11/15/13 1903     Chief Complaint  Patient presents with  . Sore Throat  . Nasal Congestion  . Otalgia     (Consider location/radiation/quality/duration/timing/severity/associated sxs/prior Treatment) HPI Comments: Patient is a 48 year old male who presents to the emergency department complaining of sore throat, bilateral ear pain, nasal congestion and dry cough x10 days. States his ear pain as throbbing and he has a full sensation of his ears. Admits to subjective fevers. He has been taking aspirin with minimal relief. He also used over-the-counter Robitussin with minimal relief. He reports his daughter who attends daycare was recently sick with thrush and an upper respiratory infection. Denies nausea, vomiting or diarrhea.  Patient is a 48 y.o. male presenting with pharyngitis and ear pain. The history is provided by the patient.  Sore Throat Associated symptoms include congestion, coughing, a fever and a sore throat.  Otalgia Associated symptoms: congestion, cough, fever and sore throat     Past Medical History  Diagnosis Date  . Hypertension   . PTSD (post-traumatic stress disorder)    Past Surgical History  Procedure Laterality Date  . Back surgery     History reviewed. No pertinent family history. History  Substance Use Topics  . Smoking status: Never Smoker   . Smokeless tobacco: Not on file  . Alcohol Use: Yes    Review of Systems  Constitutional: Positive for fever and appetite change.  HENT: Positive for congestion, ear pain and sore throat.   Respiratory: Positive for cough.   All other systems reviewed and are negative.     Allergies  Review of patient's allergies indicates no known allergies.  Home Medications   Prior to Admission medications   Medication Sig Start Date End Date Taking? Authorizing Provider  hydrochlorothiazide (HYDRODIURIL) 12.5  MG tablet Take 12.5 mg by mouth daily.   Yes Historical Provider, MD  lisinopril (PRINIVIL,ZESTRIL) 2.5 MG tablet Take 2.5 mg by mouth daily.   Yes Historical Provider, MD  venlafaxine (EFFEXOR) 100 MG tablet Take 250 mg by mouth once.   Yes Historical Provider, MD  amoxicillin (AMOXIL) 500 MG capsule Take 1 capsule (500 mg total) by mouth 3 (three) times daily. 11/15/13   Trevor Mace, PA-C  fluticasone (FLONASE) 50 MCG/ACT nasal spray Place 2 sprays into both nostrils daily. 11/15/13   Trevor Mace, PA-C   BP 142/82  Pulse 77  Temp(Src) 98.2 F (36.8 C) (Oral)  Resp 20  Ht 5\' 7"  (1.702 m)  Wt 182 lb (82.555 kg)  BMI 28.50 kg/m2  SpO2 99% Physical Exam  Nursing note and vitals reviewed. Constitutional: He is oriented to person, place, and time. He appears well-developed and well-nourished. No distress.  HENT:  Head: Normocephalic and atraumatic.  Right Ear: Ear canal normal.  Left Ear: Ear canal normal.  Nose: Mucosal edema present. Right sinus exhibits no maxillary sinus tenderness and no frontal sinus tenderness. Left sinus exhibits no maxillary sinus tenderness and no frontal sinus tenderness.  Left tympanic membrane erythematous and injected. Fluid behind bilateral tympanic membranes. Postnasal drip.  Eyes: Conjunctivae are normal.  Neck: Normal range of motion. Neck supple.  Cardiovascular: Normal rate, regular rhythm and normal heart sounds.   Pulmonary/Chest: Effort normal and breath sounds normal. No respiratory distress.  Musculoskeletal: Normal range of motion. He exhibits no edema.  Neurological: He is alert and oriented to person, place,  and time.  Skin: Skin is warm and dry. He is not diaphoretic.  Psychiatric: He has a normal mood and affect. His behavior is normal.    ED Course  Procedures (including critical care time) Labs Review Labs Reviewed - No data to display  Imaging Review No results found.   EKG Interpretation None      MDM   Final  diagnoses:  Acute nonsuppurative otitis media of left ear  URI (upper respiratory infection)   Patient well-appearing in no apparent distress. Afebrile, vital signs stable. Treat with amoxicillin and Flonase. Advised Mucinex. Followup with PCP. Stable for discharge. Return precautions given. Patient states understanding of treatment care plan and is agreeable.   Trevor MaceRobyn M Albert, PA-C 11/15/13 1941

## 2013-11-15 NOTE — Discharge Instructions (Signed)
Take antibiotic to completion. Use nasal spray as directed. Rest, stay well-hydrated. You may also use over-the-counter decongestants such as Mucinex. Otitis Media Otitis media is redness, soreness, and inflammation of the middle ear. Otitis media may be caused by allergies or, most commonly, by infection. Often it occurs as a complication of the common cold. SIGNS AND SYMPTOMS Symptoms of otitis media may include:  Earache.  Fever.  Ringing in your ear.  Headache.  Leakage of fluid from the ear. DIAGNOSIS To diagnose otitis media, your health care provider will examine your ear with an otoscope. This is an instrument that allows your health care provider to see into your ear in order to examine your eardrum. Your health care provider also will ask you questions about your symptoms. TREATMENT  Typically, otitis media resolves on its own within 3-5 days. Your health care provider may prescribe medicine to ease your symptoms of pain. If otitis media does not resolve within 5 days or is recurrent, your health care provider may prescribe antibiotic medicines if he or she suspects that a bacterial infection is the cause. HOME CARE INSTRUCTIONS   If you were prescribed an antibiotic medicine, finish it all even if you start to feel better.  Take medicines only as directed by your health care provider.  Keep all follow-up visits as directed by your health care provider. SEEK MEDICAL CARE IF:  You have otitis media only in one ear, or bleeding from your nose, or both.  You notice a lump on your neck.  You are not getting better in 3-5 days.  You feel worse instead of better. SEEK IMMEDIATE MEDICAL CARE IF:   You have pain that is not controlled with medicine.  You have swelling, redness, or pain around your ear or stiffness in your neck.  You notice that part of your face is paralyzed.  You notice that the bone behind your ear (mastoid) is tender when you touch it. MAKE SURE YOU:     Understand these instructions.  Will watch your condition.  Will get help right away if you are not doing well or get worse. Document Released: 01/08/2004 Document Revised: 08/19/2013 Document Reviewed: 10/30/2012 [facility] Patient Information 2015 [place], [place]. This information is not intended to replace advice given to you by your health care provider. Make sure you discuss any questions you have with your health care provider.  Upper Respiratory Infection, Adult An upper respiratory infection (URI) is also sometimes known as the common cold. The upper respiratory tract includes the nose, sinuses, throat, trachea, and bronchi. Bronchi are the airways leading to the lungs. Most people improve within 1 week, but symptoms can last up to 2 weeks. A residual cough may last even longer.  CAUSES Many different viruses can infect the tissues lining the upper respiratory tract. The tissues become irritated and inflamed and often become very moist. Mucus production is also common. A cold is contagious. You can easily spread the virus to others by oral contact. This includes kissing, sharing a glass, coughing, or sneezing. Touching your mouth or nose and then touching a surface, which is then touched by another person, can also spread the virus. SYMPTOMS  Symptoms typically develop 1 to 3 days after you come in contact with a cold virus. Symptoms vary from person to person. They may include:  Runny nose.  Sneezing.  Nasal congestion.  Sinus irritation.  Sore throat.  Loss of voice (laryngitis).  Cough.  Fatigue.  Muscle aches.  Loss of  appetite.  Headache.  Low-grade fever. DIAGNOSIS  You might diagnose your own cold based on familiar symptoms, since most people get a cold 2 to 3 times a year. Your caregiver can confirm this based on your exam. Most importantly, your caregiver can check that your symptoms are not due to another disease such as strep throat, sinusitis, pneumonia,  asthma, or epiglottitis. Blood tests, throat tests, and X-rays are not necessary to diagnose a common cold, but they may sometimes be helpful in excluding other more serious diseases. Your caregiver will decide if any further tests are required. RISKS AND COMPLICATIONS  You may be at risk for a more severe case of the common cold if you smoke cigarettes, have chronic heart disease (such as heart failure) or lung disease (such as asthma), or if you have a weakened immune system. The very young and very old are also at risk for more serious infections. Bacterial sinusitis, middle ear infections, and bacterial pneumonia can complicate the common cold. The common cold can worsen asthma and chronic obstructive pulmonary disease (COPD). Sometimes, these complications can require emergency medical care and may be life-threatening. PREVENTION  The best way to protect against getting a cold is to practice good hygiene. Avoid oral or hand contact with people with cold symptoms. Wash your hands often if contact occurs. There is no clear evidence that vitamin C, vitamin E, echinacea, or exercise reduces the chance of developing a cold. However, it is always recommended to get plenty of rest and practice good nutrition. TREATMENT  Treatment is directed at relieving symptoms. There is no cure. Antibiotics are not effective, because the infection is caused by a virus, not by bacteria. Treatment may include:  Increased fluid intake. Sports drinks offer valuable electrolytes, sugars, and fluids.  Breathing heated mist or steam (vaporizer or shower).  Eating chicken soup or other clear broths, and maintaining good nutrition.  Getting plenty of rest.  Using gargles or lozenges for comfort.  Controlling fevers with ibuprofen or acetaminophen as directed by your caregiver.  Increasing usage of your inhaler if you have asthma. Zinc gel and zinc lozenges, taken in the first 24 hours of the common cold, can shorten the  duration and lessen the severity of symptoms. Pain medicines may help with fever, muscle aches, and throat pain. A variety of non-prescription medicines are available to treat congestion and runny nose. Your caregiver can make recommendations and may suggest nasal or lung inhalers for other symptoms.  HOME CARE INSTRUCTIONS   Only take over-the-counter or prescription medicines for pain, discomfort, or fever as directed by your caregiver.  Use a warm mist humidifier or inhale steam from a shower to increase air moisture. This may keep secretions moist and make it easier to breathe.  Drink enough water and fluids to keep your urine clear or pale yellow.  Rest as needed.  Return to work when your temperature has returned to normal or as your caregiver advises. You may need to stay home longer to avoid infecting others. You can also use a face mask and careful hand washing to prevent spread of the virus. SEEK MEDICAL CARE IF:   After the first few days, you feel you are getting worse rather than better.  You need your caregiver's advice about medicines to control symptoms.  You develop chills, worsening shortness of breath, or Baynes or red sputum. These may be signs of pneumonia.  You develop yellow or Kurtzman nasal discharge or pain in the face, especially when  you bend forward. These may be signs of sinusitis.  You develop a fever, swollen neck glands, pain with swallowing, or white areas in the back of your throat. These may be signs of strep throat. SEEK IMMEDIATE MEDICAL CARE IF:   You have a fever.  You develop severe or persistent headache, ear pain, sinus pain, or chest pain.  You develop wheezing, a prolonged cough, cough up blood, or have a change in your usual mucus (if you have chronic lung disease).  You develop sore muscles or a stiff neck. Document Released: 09/28/2000 Document Revised: 06/27/2011 Document Reviewed: 07/10/2013 [facility] Patient Information 2015 [place],  [place]. This information is not intended to replace advice given to you by your health care provider. Make sure you discuss any questions you have with your health care provider.

## 2013-11-15 NOTE — ED Provider Notes (Signed)
Medical screening examination/treatment/procedure(s) were performed by non-physician practitioner and as supervising physician I was immediately available for consultation/collaboration.   EKG Interpretation None       Ethelda ChickMartha K Linker, MD 11/15/13 2000

## 2013-11-15 NOTE — ED Notes (Signed)
Pt reports sore throat, bilat ear pain and nasal congestion x5 days - pt admits to taking OTC meds w/o relief. Pt denies fever.

## 2013-11-29 ENCOUNTER — Encounter (HOSPITAL_BASED_OUTPATIENT_CLINIC_OR_DEPARTMENT_OTHER): Payer: Self-pay | Admitting: Emergency Medicine

## 2013-11-29 ENCOUNTER — Emergency Department (HOSPITAL_BASED_OUTPATIENT_CLINIC_OR_DEPARTMENT_OTHER)
Admission: EM | Admit: 2013-11-29 | Discharge: 2013-11-29 | Disposition: A | Discharge status: Home / Self Care | Payer: Medicare Other | Attending: Emergency Medicine | Admitting: Emergency Medicine

## 2013-11-29 DIAGNOSIS — IMO0002 Reserved for concepts with insufficient information to code with codable children: Secondary | ICD-10-CM | POA: Insufficient documentation

## 2013-11-29 DIAGNOSIS — Z9889 Other specified postprocedural states: Secondary | ICD-10-CM | POA: Insufficient documentation

## 2013-11-29 DIAGNOSIS — Z792 Long term (current) use of antibiotics: Secondary | ICD-10-CM | POA: Insufficient documentation

## 2013-11-29 DIAGNOSIS — G43909 Migraine, unspecified, not intractable, without status migrainosus: Secondary | ICD-10-CM | POA: Insufficient documentation

## 2013-11-29 DIAGNOSIS — G8911 Acute pain due to trauma: Secondary | ICD-10-CM | POA: Insufficient documentation

## 2013-11-29 DIAGNOSIS — H9209 Otalgia, unspecified ear: Secondary | ICD-10-CM | POA: Insufficient documentation

## 2013-11-29 DIAGNOSIS — R0981 Nasal congestion: Secondary | ICD-10-CM

## 2013-11-29 DIAGNOSIS — I10 Essential (primary) hypertension: Secondary | ICD-10-CM | POA: Insufficient documentation

## 2013-11-29 DIAGNOSIS — G8929 Other chronic pain: Secondary | ICD-10-CM

## 2013-11-29 DIAGNOSIS — M549 Dorsalgia, unspecified: Secondary | ICD-10-CM | POA: Insufficient documentation

## 2013-11-29 DIAGNOSIS — J3489 Other specified disorders of nose and nasal sinuses: Secondary | ICD-10-CM | POA: Insufficient documentation

## 2013-11-29 DIAGNOSIS — Z8659 Personal history of other mental and behavioral disorders: Secondary | ICD-10-CM | POA: Insufficient documentation

## 2013-11-29 DIAGNOSIS — Z79899 Other long term (current) drug therapy: Secondary | ICD-10-CM | POA: Insufficient documentation

## 2013-11-29 HISTORY — DX: Migraine, unspecified, not intractable, without status migrainosus: G43.909

## 2013-11-29 MED ORDER — LORATADINE 10 MG PO TABS: 10.0000 mg | ORAL_TABLET | Freq: Every day | ORAL | Status: DC

## 2013-11-29 MED ORDER — IBUPROFEN 600 MG PO TABS: 600.0000 mg | ORAL_TABLET | Freq: Four times a day (QID) | ORAL | Status: DC | PRN

## 2013-11-29 MED ORDER — CYCLOBENZAPRINE HCL 10 MG PO TABS
5.0000 mg | ORAL_TABLET | Freq: Once | ORAL | Status: AC
  Administered 2013-11-29: 5 mg via ORAL
  Filled 2013-11-29: qty 1

## 2013-11-29 MED ORDER — OXYCODONE-ACETAMINOPHEN 5-325 MG PO TABS: 2.0000 | ORAL_TABLET | Freq: Once | ORAL | Status: DC

## 2013-11-29 MED ORDER — CYCLOBENZAPRINE HCL 5 MG PO TABS: 5.0000 mg | ORAL_TABLET | Freq: Three times a day (TID) | ORAL | Status: DC | PRN

## 2013-11-29 MED ORDER — KETOROLAC TROMETHAMINE 60 MG/2ML IM SOLN
60.0000 mg | Freq: Once | INTRAMUSCULAR | Status: AC
  Administered 2013-11-29: 60 mg via INTRAMUSCULAR
  Filled 2013-11-29: qty 2

## 2013-11-29 NOTE — Discharge Instructions (Signed)
Upper Respiratory Infection, Adult Your symptoms may also be related to allergies.  An upper respiratory infection (URI) is also known as the common cold. It is often caused by a type of germ (virus). Colds are easily spread (contagious). You can pass it to others by kissing, coughing, sneezing, or drinking out of the same glass. Usually, you get better in 1 or 2 weeks.  HOME CARE   Only take medicine as told by your doctor.  Use a warm mist humidifier or breathe in steam from a hot shower.  Drink enough water and fluids to keep your pee (urine) clear or pale yellow.  Get plenty of rest.  Return to work when your temperature is back to normal or as told by your doctor. You may use a face mask and wash your hands to stop your cold from spreading. GET HELP RIGHT AWAY IF:   After the first few days, you feel you are getting worse.  You have questions about your medicine.  You have chills, shortness of breath, or Davis or red spit (mucus).  You have yellow or Zale snot (nasal discharge) or pain in the face, especially when you bend forward.  You have a fever, puffy (swollen) neck, pain when you swallow, or white spots in the back of your throat.  You have a bad headache, ear pain, sinus pain, or chest pain.  You have a high-pitched whistling sound when you breathe in and out (wheezing).  You have a lasting cough or cough up blood.  You have sore muscles or a stiff neck. MAKE SURE YOU:   Understand these instructions.  Will watch your condition.  Will get help right away if you are not doing well or get worse. Document Released: 09/21/2007 Document Revised: 06/27/2011 Document Reviewed: 07/10/2013 [facility] Patient Information 2015 [place], [place]. This information is not intended to replace advice given to you by your health care provider. Make sure you discuss any questions you have with your health care provider. Back Pain, Adult Low back pain is very common. About 1 in 5  people have back pain.The cause of low back pain is rarely dangerous. The pain often gets better over time.About half of people with a sudden onset of back pain feel better in just 2 weeks. About 8 in 10 people feel better by 6 weeks.  CAUSES Some common causes of back pain include:  Strain of the muscles or ligaments supporting the spine.  Wear and tear (degeneration) of the spinal discs.  Arthritis.  Direct injury to the back. DIAGNOSIS Most of the time, the direct cause of low back pain is not known.However, back pain can be treated effectively even when the exact cause of the pain is unknown.Answering your caregiver's questions about your overall health and symptoms is one of the most accurate ways to make sure the cause of your pain is not dangerous. If your caregiver needs more information, he or she may order lab work or imaging tests (X-rays or MRIs).However, even if imaging tests show changes in your back, this usually does not require surgery. HOME CARE INSTRUCTIONS For many people, back pain returns.Since low back pain is rarely dangerous, it is often a condition that people can learn to [facility] their own.   Remain active. It is stressful on the back to sit or stand in one place. Do not sit, drive, or stand in one place for more than 30 minutes at a time. Take short walks on level surfaces as soon as  pain allows.Try to increase the length of time you walk each day.  Do not stay in bed.Resting more than 1 or 2 days can delay your recovery.  Do not avoid exercise or work.Your body is made to move.It is not dangerous to be active, even though your back may hurt.Your back will likely heal faster if you return to being active before your pain is gone.  Pay attention to your body when you bend and lift. Many people have less discomfortwhen lifting if they bend their knees, keep the load close to their bodies,and avoid twisting. Often, the most comfortable positions are  those that put less stress on your recovering back.  Find a comfortable position to sleep. Use a firm mattress and lie on your side with your knees slightly bent. If you lie on your back, put a pillow under your knees.  Only take over-the-counter or prescription medicines as directed by your caregiver. Over-the-counter medicines to reduce pain and inflammation are often the most helpful.Your caregiver may prescribe muscle relaxant drugs.These medicines help dull your pain so you can more quickly return to your normal activities and healthy exercise.  Put ice on the injured area.  Put ice in a plastic bag.  Place a towel between your skin and the bag.  Leave the ice on for 15-20 minutes, 03-04 times a day for the first 2 to 3 days. After that, ice and heat may be alternated to reduce pain and spasms.  Ask your caregiver about trying back exercises and gentle massage. This may be of some benefit.  Avoid feeling anxious or stressed.Stress increases muscle tension and can worsen back pain.It is important to recognize when you are anxious or stressed and learn ways to manage it.Exercise is a great option. SEEK MEDICAL CARE IF:  You have pain that is not relieved with rest or medicine.  You have pain that does not improve in 1 week.  You have new symptoms.  You are generally not feeling well. SEEK IMMEDIATE MEDICAL CARE IF:   You have pain that radiates from your back into your legs.  You develop new bowel or bladder control problems.  You have unusual weakness or numbness in your arms or legs.  You develop nausea or vomiting.  You develop abdominal pain.  You feel faint. Document Released: 04/04/2005 Document Revised: 10/04/2011 Document Reviewed: 08/06/2013 [facility] Patient Information 2015 [place]. This information is not intended to replace advice given to you by your health care provider. Make sure you discuss any questions you have with your health care  provider.

## 2013-11-29 NOTE — ED Provider Notes (Signed)
CSN: [id number]     Arrival date & time 11/29/13  [id number] History   First MD Initiated Contact with Patient 11/29/13 [id number]     Chief Complaint  Patient presents with  . Back Pain     (Consider location/radiation/quality/duration/timing/severity/associated sxs/prior Treatment) HPI  This is a 48 year old male who presents with back pain. History of hypertension, migraines, and chronic back pain secondary to injury. Patient states that he was seen and evaluated several weeks ago and diagnosed with an ear infection. He states that after he got home he had very forceful sneeze and feels like he "threw out his back." He reports spasm over the left lower back. Denies any radiation of pain into his legs..Denies any weakness, numbness, or tingling. Denies any bowel or bladder issues. Currently his pain is 8/10. He has tried a ibuprofen and Percocet at home with minimal relief. Patient also states that he continues to feel congested and that his ears feel full. He denies any fevers, cough, shortness of breath or chest pain.  Past Medical History  Diagnosis Date  . Hypertension   . PTSD (post-traumatic stress disorder)   . Migraine    Past Surgical History  Procedure Laterality Date  . Back surgery     No family history on file. History  Substance Use Topics  . Smoking status: Never Smoker   . Smokeless tobacco: Not on file  . Alcohol Use: Yes    Review of Systems  Constitutional: Negative.  Negative for fever.  HENT: Positive for congestion and ear pain.   Respiratory: Negative.  Negative for chest tightness and shortness of breath.   Cardiovascular: Negative.  Negative for chest pain.  Gastrointestinal: Negative.  Negative for abdominal pain.  Genitourinary: Negative.  Negative for dysuria and decreased urine volume.  Musculoskeletal: Positive for back pain. Negative for gait problem.  Neurological: Negative for headaches.  All other systems reviewed and are negative.     Allergies   Trazodone and nefazodone  Home Medications   Prior to Admission medications   Medication Sig Start Date End Date Taking? Authorizing Provider  hydrochlorothiazide (HYDRODIURIL) 12.5 MG tablet Take 12.5 mg by mouth daily.   Yes Historical Provider, MD  lisinopril (PRINIVIL,ZESTRIL) 2.5 MG tablet Take 2.5 mg by mouth daily.   Yes Historical Provider, MD  venlafaxine (EFFEXOR) 100 MG tablet Take 250 mg by mouth once.   Yes Historical Provider, MD  amoxicillin (AMOXIL) 500 MG capsule Take 1 capsule (500 mg total) by mouth 3 (three) times daily. 11/15/13   Trevor Maceobyn M Albert, PA-C  cyclobenzaprine (FLEXERIL) 5 MG tablet Take 1 tablet (5 mg total) by mouth 3 (three) times daily as needed for muscle spasms. 11/29/13   Shon Batonourtney F Shebra Muldrow, MD  fluticasone (FLONASE) 50 MCG/ACT nasal spray Place 2 sprays into both nostrils daily. 11/15/13   Trevor Maceobyn M Albert, PA-C  ibuprofen (ADVIL,MOTRIN) 600 MG tablet Take 1 tablet (600 mg total) by mouth every 6 (six) hours as needed. 11/29/13   Shon Batonourtney F Jennea Rager, MD  loratadine (CLARITIN) 10 MG tablet Take 1 tablet (10 mg total) by mouth daily. 11/29/13   Shon Batonourtney F Nikolas Casher, MD   BP 147/94  Pulse 79  Temp(Src) 98.1 F (36.7 C) (Oral)  Resp 18  Ht 5\' 7"  (1.702 m)  Wt 182 lb (82.555 kg)  BMI 28.50 kg/m2  SpO2 100% Physical Exam  Nursing note and vitals reviewed. Constitutional: He is oriented to person, place, and time. He appears well-developed and well-nourished.  HENT:  Head:  Normocephalic and atraumatic.  Right Ear: External ear normal.  Left Ear: External ear normal.  Mouth/Throat: Oropharynx is clear and moist.  Eyes: Pupils are equal, round, and reactive to light.  Cardiovascular: Normal rate and regular rhythm.   Pulmonary/Chest: Effort normal. No respiratory distress.  Musculoskeletal: He exhibits no edema.  Tenderness to palpation over the left paraspinous muscle region of the lumbar spine, no midline lumbar tenderness, step-off, or deformity, old vertical  midline incision noted over the lumbar spine, negative straight leg raise  Neurological: He is alert and oriented to person, place, and time.  5 out of 5 strength in the bilateral lower extremities, equal reflexes bilaterally  Skin: Skin is warm and dry.  Psychiatric: He has a normal mood and affect.    ED Course  Procedures (including critical care time) Labs Review Labs Reviewed - No data to display  Imaging Review No results found.   EKG Interpretation None      MDM   Final diagnoses:  Chronic back pain  Nasal congestion    Patient presents with back pain and continued nasal congestion. He is nontoxic on exam. He is nonfocal with good strength and no signs of cauda equina. Patient does not have a ride. He is requesting IM Dilaudid but was told he would not receive this given that he does not have a ride. Patient was given Toradol and Flexeril. He does have muscle spasm on exam. Will discharge with a short course of Flexeril. He has Percocet at home and was also encouraged to continue anti-inflammatories. Regarding patient's nasal congestion, physical exam is reassuring. No signs of continued otitis. Would consider allergic component given duration of symptoms. Will add Claritin and have encouraged the patient to continue nasal saline. Patient stated understanding.  After history, exam, and medical workup I feel the patient has been appropriately medically screened and is safe for discharge home. Pertinent diagnoses were discussed with the patient. Patient was given return precautions.     Shon Baton, MD 11/29/13 1026

## 2013-11-29 NOTE — ED Notes (Signed)
Patient states he has a ten day history of lower back pain.  States he has chronic lower back pain from an old injury in 1995, and surgery in 1999.  States he was seen here and treated for URI, which is associated with sneezing.  States he sneezed and had a sudden severe pain in his lower back.  States he finished his antibiotics and continues to have fullness in his ears, sinus drainage and frequent sneezing.  States he needs a shot of Dilaudid to help.  Currently is calling for a ride.

## 2014-01-09 ENCOUNTER — Encounter (HOSPITAL_BASED_OUTPATIENT_CLINIC_OR_DEPARTMENT_OTHER): Payer: Self-pay | Admitting: Emergency Medicine

## 2014-01-09 ENCOUNTER — Emergency Department (HOSPITAL_BASED_OUTPATIENT_CLINIC_OR_DEPARTMENT_OTHER)
Admission: EM | Admit: 2014-01-09 | Discharge: 2014-01-09 | Disposition: A | Discharge status: Home / Self Care | Payer: Medicare Other | Attending: Emergency Medicine | Admitting: Emergency Medicine

## 2014-01-09 DIAGNOSIS — M545 Low back pain, unspecified: Secondary | ICD-10-CM | POA: Insufficient documentation

## 2014-01-09 DIAGNOSIS — F4310 Post-traumatic stress disorder, unspecified: Secondary | ICD-10-CM | POA: Insufficient documentation

## 2014-01-09 DIAGNOSIS — M5430 Sciatica, unspecified side: Secondary | ICD-10-CM | POA: Diagnosis not present

## 2014-01-09 DIAGNOSIS — Z79899 Other long term (current) drug therapy: Secondary | ICD-10-CM | POA: Insufficient documentation

## 2014-01-09 DIAGNOSIS — I10 Essential (primary) hypertension: Secondary | ICD-10-CM | POA: Diagnosis not present

## 2014-01-09 DIAGNOSIS — G43909 Migraine, unspecified, not intractable, without status migrainosus: Secondary | ICD-10-CM | POA: Diagnosis not present

## 2014-01-09 DIAGNOSIS — IMO0002 Reserved for concepts with insufficient information to code with codable children: Secondary | ICD-10-CM | POA: Insufficient documentation

## 2014-01-09 DIAGNOSIS — M5432 Sciatica, left side: Secondary | ICD-10-CM

## 2014-01-09 DIAGNOSIS — Z792 Long term (current) use of antibiotics: Secondary | ICD-10-CM | POA: Diagnosis not present

## 2014-01-09 MED ORDER — KETOROLAC TROMETHAMINE 15 MG/ML IJ SOLN
INTRAMUSCULAR | Status: AC
  Filled 2014-01-09: qty 1

## 2014-01-09 MED ORDER — HYDROMORPHONE HCL 1 MG/ML IJ SOLN
2.0000 mg | Freq: Once | INTRAMUSCULAR | Status: AC
  Administered 2014-01-09: 2 mg via INTRAMUSCULAR
  Filled 2014-01-09: qty 2

## 2014-01-09 MED ORDER — HYDROMORPHONE HCL 4 MG PO TABS: 4.0000 mg | ORAL_TABLET | ORAL | Status: DC | PRN

## 2014-01-09 MED ORDER — KETOROLAC TROMETHAMINE 15 MG/ML IJ SOLN
30.0000 mg | Freq: Once | INTRAMUSCULAR | Status: AC
  Administered 2014-01-09: 30 mg via INTRAMUSCULAR
  Filled 2014-01-09: qty 2

## 2014-01-09 NOTE — ED Notes (Signed)
Pt reports severe muscle spasms to lower back that started Monday night, tried heat, ice and ibuprofen but no relief

## 2014-01-09 NOTE — ED Notes (Signed)
MD at bedside. 

## 2014-01-09 NOTE — ED Notes (Signed)
Pt states that he has had this problem for 20 years, [facility] of [facility], states that his wife dropped him off tonight

## 2014-01-09 NOTE — ED Provider Notes (Addendum)
CSN: [id number]     Arrival date & time 01/09/14  0350 History   First MD Initiated Contact with Patient 01/09/14 [phone]     Chief Complaint  Patient presents with  . Back Pain     (Consider location/radiation/quality/duration/timing/severity/associated sxs/prior Treatment) HPI This is a 48 [facility] with history of episodic low back pain. He is here with left-sided low back pain that began 3 evenings ago. He thinks it may have been triggered by lifting. The pain is located in the left paralumbar region and radiates down his left thigh. There is no associated numbness or weakness. He states the pain is severe and worse with movement. It has not been relieved by ibuprofen or Percocet. He states he has Percocet at home but is requesting a shot of something stronger her to make the pain manageable. He has been attempting to get into the [facility] for followup.  Past Medical History  Diagnosis Date  . Hypertension   . PTSD (post-traumatic stress disorder)   . Migraine    Past Surgical History  Procedure Laterality Date  . Back surgery     History reviewed. No pertinent family history. History  Substance Use Topics  . Smoking status: Never Smoker   . Smokeless tobacco: Not on file  . Alcohol Use: Yes    Review of Systems  All other systems reviewed and are negative.  Allergies  Trazodone and nefazodone  Home Medications   Prior to Admission medications   Medication Sig Start Date End Date Taking? Authorizing Provider  hydrochlorothiazide (HYDRODIURIL) 12.5 MG tablet Take 12.5 mg by mouth daily.   Yes Historical Provider, MD  ibuprofen (ADVIL,MOTRIN) 600 MG tablet Take 1 tablet (600 mg total) by mouth every 6 (six) hours as needed. 11/29/13  Yes Shon Baton, MD  lisinopril (PRINIVIL,ZESTRIL) 2.5 MG tablet Take 2.5 mg by mouth daily.   Yes Historical Provider, MD  venlafaxine (EFFEXOR) 100 MG tablet Take 250 mg by mouth once.   Yes Historical Provider, MD   amoxicillin (AMOXIL) 500 MG capsule Take 1 capsule (500 mg total) by mouth 3 (three) times daily. 11/15/13   Trevor Mace, PA-C  cyclobenzaprine (FLEXERIL) 5 MG tablet Take 1 tablet (5 mg total) by mouth 3 (three) times daily as needed for muscle spasms. 11/29/13   Shon Baton, MD  fluticasone (FLONASE) 50 MCG/ACT nasal spray Place 2 sprays into both nostrils daily. 11/15/13   Trevor Mace, PA-C  loratadine (CLARITIN) 10 MG tablet Take 1 tablet (10 mg total) by mouth daily. 11/29/13   Shon Baton, MD   BP 111/89  Pulse 72  Temp(Src) 98.4 F (36.9 C) (Oral)  Resp 20  Ht  (1.702 m)  Wt 180 lb (81.647 kg)  BMI 28.19 kg/m2  SpO2 98%  Physical Exam General: Well-developed, well-nourished male in no acute distress; appears uncomfortable; appearance consistent with age of record HENT: normocephalic; atraumatic Eyes: pupils equal, round and reactive to light; extraocular muscles intact Neck: supple Heart: regular rate and rhythm Lungs: clear to auscultation bilaterally Abdomen: soft; nondistended; nontender Back: Left SI tenderness; no muscle spasm palpated Extremities: No deformity; full range of motion except decreased range of motion left hip due to pain; pulses normal Neurologic: Awake, alert and oriented; motor function intact in all extremities and symmetric; sensation intact in lower extremities and symmetric; no facial droop Skin: Warm and dry Psychiatric: Flat affect    ED Course  Procedures (including critical care time)  MDM      Hanley Seamen, MD 01/09/14 4782  Hanley Seamen, MD 01/09/14 9562

## 2014-05-15 ENCOUNTER — Emergency Department (HOSPITAL_BASED_OUTPATIENT_CLINIC_OR_DEPARTMENT_OTHER)
Admission: EM | Admit: 2014-05-15 | Discharge: 2014-05-15 | Disposition: A | Discharge status: Home / Self Care | Payer: Medicare Other | Attending: Emergency Medicine | Admitting: Emergency Medicine

## 2014-05-15 ENCOUNTER — Encounter (HOSPITAL_BASED_OUTPATIENT_CLINIC_OR_DEPARTMENT_OTHER): Payer: Self-pay | Admitting: *Deleted

## 2014-05-15 ENCOUNTER — Emergency Department (HOSPITAL_BASED_OUTPATIENT_CLINIC_OR_DEPARTMENT_OTHER): Payer: Medicare Other

## 2014-05-15 DIAGNOSIS — R079 Chest pain, unspecified: Secondary | ICD-10-CM | POA: Diagnosis present

## 2014-05-15 DIAGNOSIS — Z7952 Long term (current) use of systemic steroids: Secondary | ICD-10-CM | POA: Diagnosis not present

## 2014-05-15 DIAGNOSIS — I10 Essential (primary) hypertension: Secondary | ICD-10-CM | POA: Diagnosis not present

## 2014-05-15 DIAGNOSIS — R0789 Other chest pain: Secondary | ICD-10-CM | POA: Diagnosis not present

## 2014-05-15 DIAGNOSIS — Z8659 Personal history of other mental and behavioral disorders: Secondary | ICD-10-CM | POA: Diagnosis not present

## 2014-05-15 DIAGNOSIS — Z79899 Other long term (current) drug therapy: Secondary | ICD-10-CM | POA: Insufficient documentation

## 2014-05-15 DIAGNOSIS — Z792 Long term (current) use of antibiotics: Secondary | ICD-10-CM | POA: Insufficient documentation

## 2014-05-15 DIAGNOSIS — J159 Unspecified bacterial pneumonia: Secondary | ICD-10-CM | POA: Diagnosis not present

## 2014-05-15 DIAGNOSIS — G43909 Migraine, unspecified, not intractable, without status migrainosus: Secondary | ICD-10-CM | POA: Diagnosis not present

## 2014-05-15 DIAGNOSIS — J189 Pneumonia, unspecified organism: Secondary | ICD-10-CM

## 2014-05-15 LAB — BASIC METABOLIC PANEL
Anion gap: 4 — ABNORMAL LOW (ref 5–15)
BUN: 10 mg/dL (ref 6–23)
CO2: 26 mmol/L (ref 19–32)
Calcium: 8.7 mg/dL (ref 8.4–10.5)
Chloride: 105 mmol/L (ref 96–112)
Creatinine, Ser: 0.99 mg/dL (ref 0.50–1.35)
GFR calc Af Amer: >90 mL/min (ref >90)
GFR calc non Af Amer: >90 mL/min (ref >90)
Glucose, Bld: 88 mg/dL (ref 70–99)
Potassium: 3.5 mmol/L (ref 3.5–5.1)
Sodium: 135 mmol/L (ref 135–145)

## 2014-05-15 LAB — CBC
HCT: 40.9 % (ref 39.0–52.0)
Hemoglobin: 14 g/dL (ref 13.0–17.0)
MCH: 29.7 pg (ref 26.0–34.0)
MCHC: 34.2 g/dL (ref 30.0–36.0)
MCV: 86.7 fL (ref 78.0–100.0)
Platelets: 177 10*3/uL (ref 150–400)
RBC: 4.72 MIL/uL (ref 4.22–5.81)
RDW: 12.4 % (ref 11.5–15.5)
WBC: 6.1 10*3/uL (ref 4.0–10.5)

## 2014-05-15 LAB — TROPONIN I: Troponin I: <0.03 ng/mL (ref <0.031)

## 2014-05-15 MED ORDER — HYDROCODONE-ACETAMINOPHEN 5-325 MG PO TABS: 1.0000 | ORAL_TABLET | ORAL | Status: DC | PRN

## 2014-05-15 MED ORDER — AZITHROMYCIN 250 MG PO TABS: 250.0000 mg | ORAL_TABLET | Freq: Every day | ORAL | Status: DC

## 2014-05-15 MED ORDER — DIAZEPAM 5 MG PO TABS: 5.0000 mg | ORAL_TABLET | Freq: Two times a day (BID) | ORAL | Status: DC | PRN

## 2014-05-15 MED ORDER — IBUPROFEN 800 MG PO TABS: 800.0000 mg | ORAL_TABLET | Freq: Three times a day (TID) | ORAL | Status: DC

## 2014-05-15 MED ORDER — IBUPROFEN 800 MG PO TABS
800.0000 mg | ORAL_TABLET | Freq: Once | ORAL | Status: AC
  Administered 2014-05-15: 800 mg via ORAL
  Filled 2014-05-15: qty 1

## 2014-05-15 MED ORDER — DIAZEPAM 5 MG PO TABS
5.0000 mg | ORAL_TABLET | Freq: Once | ORAL | Status: AC
  Administered 2014-05-15: 5 mg via ORAL
  Filled 2014-05-15: qty 1

## 2014-05-15 NOTE — ED Notes (Signed)
Pt sts he changed a tire on Sunday and woke up Monday with stabbing central chest pain. Pt sts pain is worse when he moves his arms.

## 2014-05-15 NOTE — ED Provider Notes (Signed)
CSN: [id number]     Arrival date & time 05/15/14  1022 History   First MD Initiated Contact with Patient 05/15/14 1201     Chief Complaint  Patient presents with  . Chest Pain     (Consider location/radiation/quality/duration/timing/severity/associated sxs/prior Treatment) HPI Comments: 49 year old male with a past medical history of hypertension and PTSD presenting to the ED complaining of midsternal chest pain 5 days. Patient reports 6 days ago on Saturday he was changing a tire in the snow, and the next day he woke up with sharp, stabbing chest pain described as a broom stick sticking into his chest. Pain is constant, 7/10, with occasional less than a second spasm feeling that comes and goes at random. Pain worse when he moves his arms a certain way. He has not tried any alleviating factors for his symptoms. Denies numbness or tingling down his extremities. Denies shortness of breath, nausea, vomiting or diaphoresis. Denies ever having chest pain in the past. Nonsmoker. Denies family history of early heart disease.  Patient is a 49 y.o. male presenting with chest pain. The history is provided by the patient.  Chest Pain   Past Medical History  Diagnosis Date  . Hypertension   . PTSD (post-traumatic stress disorder)   . Migraine    Past Surgical History  Procedure Laterality Date  . Back surgery     No family history on file. History  Substance Use Topics  . Smoking status: Never Smoker   . Smokeless tobacco: Not on file  . Alcohol Use: Yes    Review of Systems  Cardiovascular: Positive for chest pain.  All other systems reviewed and are negative.     Allergies  Trazodone and nefazodone  Home Medications   Prior to Admission medications   Medication Sig Start Date End Date Taking? Authorizing Provider  amoxicillin (AMOXIL) 500 MG capsule Take 1 capsule (500 mg total) by mouth 3 (three) times daily. 11/15/13   Teresita Fanton M Ebany Bowermaster, PA-C  azithromycin (ZITHROMAX) 250 MG  tablet Take 1 tablet (250 mg total) by mouth daily. Take first 2 tablets together, then 1 every day until finished. 05/15/14   Eleesha Purkey M Suman Trivedi, PA-C  cyclobenzaprine (FLEXERIL) 5 MG tablet Take 1 tablet (5 mg total) by mouth 3 (three) times daily as needed for muscle spasms. 11/29/13   Shon Batonourtney F Horton, MD  diazepam (VALIUM) 5 MG tablet Take 1 tablet (5 mg total) by mouth every 12 (twelve) hours as needed for muscle spasms. 05/15/14   Aoki Wedemeyer M Mariavictoria Nottingham, PA-C  fluticasone (FLONASE) 50 MCG/ACT nasal spray Place 2 sprays into both nostrils daily. 11/15/13   Blakely Gluth M Jacquan Savas, PA-C  hydrochlorothiazide (HYDRODIURIL) 12.5 MG tablet Take 12.5 mg by mouth daily.    Historical Provider, MD  HYDROcodone-acetaminophen (NORCO/VICODIN) 5-325 MG per tablet Take 1-2 tablets by mouth every 4 (four) hours as needed. 05/15/14   Omero Kowal M Gabrille Kilbride, PA-C  HYDROmorphone (DILAUDID) 4 MG tablet Take 1 tablet (4 mg total) by mouth every 4 (four) hours as needed for severe pain. 01/09/14   Carlisle BeersJohn L Molpus, MD  ibuprofen (ADVIL,MOTRIN) 800 MG tablet Take 1 tablet (800 mg total) by mouth 3 (three) times daily. 05/15/14   Gae Bihl M Cordon Gassett, PA-C  lisinopril (PRINIVIL,ZESTRIL) 2.5 MG tablet Take 2.5 mg by mouth daily.    Historical Provider, MD  loratadine (CLARITIN) 10 MG tablet Take 1 tablet (10 mg total) by mouth daily. 11/29/13   Shon Batonourtney F Horton, MD  venlafaxine (EFFEXOR) 100 MG tablet Take 250  mg by mouth once.    Historical Provider, MD   BP 139/95 mmHg  Pulse 69  Temp(Src) 98.5 F (36.9 C) (Oral)  Resp 18  Ht  (1.702 m)  Wt 180 lb (81.647 kg)  BMI 28.19 kg/m2  SpO2 100% Physical Exam  Constitutional: He is oriented to person, place, and time. He appears well-developed and well-nourished. No distress.  HENT:  Head: Normocephalic and atraumatic.  Mouth/Throat: Oropharynx is clear and moist.  Eyes: Conjunctivae and EOM are normal. Pupils are equal, round, and reactive to light.  Neck: Normal range of motion. Neck supple. No JVD present.    Cardiovascular: Normal rate, regular rhythm, normal heart sounds and intact distal pulses.   No extremity edema.  Pulmonary/Chest: Effort normal and breath sounds normal. No respiratory distress.    Abdominal: Soft. Bowel sounds are normal. There is no tenderness.  Musculoskeletal: Normal range of motion. He exhibits no edema.  Neurological: He is alert and oriented to person, place, and time. He has normal strength. No sensory deficit.  Speech fluent, goal oriented. Moves limbs without ataxia. Equal grip strength bilateral.  Skin: Skin is warm and dry. He is not diaphoretic.  Psychiatric: He has a normal mood and affect. His behavior is normal.  Nursing note and vitals reviewed.   ED Course  Procedures (including critical care time) Labs Review Labs Reviewed  BASIC METABOLIC PANEL - Abnormal; Notable for the following:    Anion gap 4 (*)    All other components within normal limits  CBC  TROPONIN I    Imaging Review Dg Chest 2 View  05/15/2014   CLINICAL DATA:  Intermittent midsternal pain for the past 4 days  EXAM: CHEST  2 VIEW  COMPARISON:  Portable chest x-ray of March 17, 2010  FINDINGS: The lungs are adequately inflated. There is hazy increased parenchymal density in the right upper lobe. The heart and pulmonary vascularity are normal. The mediastinum is normal in width. There is no pleural effusion or pneumothorax. The bony thorax is unremarkable.  IMPRESSION: Ill-defined alveolar opacity in the right upper lobe is consistent with early pneumonia. Follow-up radiographs following anticipated antibiotic therapy are recommended to assure clearing.   Electronically Signed   By: David  [place]   On: 05/15/2014 12:51     EKG Interpretation   Date/Time:  Thursday May 15 2014 10:37:49 EST Ventricular Rate:  63 PR Interval:  158 QRS Duration: 74 QT Interval:  382 QTC Calculation: 390 R Axis:   9 Text Interpretation:  Normal sinus rhythm Nonspecific T wave abnormality   Abnormal ECG No old tracing to compare Confirmed by WARD,  DO, KRISTEN  ([id number]) on 05/15/2014 10:53:40 AM      MDM   Final diagnoses:  CAP (community acquired pneumonia)  Chest wall pain   Patient in no apparent distress. Vital signs stable. Chest pain after moving entire, beginning the next day. Chest pain is reproducible on exam. There are inverted T waves inferiorly on his EKG without any old EKG to compare. Labs obtained to further evaluate chest pain. Labs without acute finding. Doubt cardiac. HEART score 3, and seems to be more MSK. Chest x-ray showing ill-defined alveolar pacing in the right upper lobe consistent with early pneumonia. On discussing these results, patient reports he has had pneumonia 3 times in the past. Denies any cough or fevers. Will treat with azithromycin. Chest pain beginning to improve after receiving ibuprofen and Valium. Will discharge home with azithromycin, Vicodin, Valium and ibuprofen.  Follow-up with PCP once antibiotics complete. Stable for discharge. Return precautions given. Patient states understanding of treatment care plan and is agreeable.  Kathrynn Speed, PA-C 05/15/14 1339  Layla Maw Ward, DO 05/15/14 1455

## 2014-05-15 NOTE — Discharge Instructions (Signed)
Take Vicodin for severe pain only. No driving or operating heavy machinery while taking vicodin. This medication may cause drowsiness. Take Valium as needed as directed for muscle spasm. No driving or operating heavy machinery while taking valium. This medication may cause drowsiness. Take ibuprofen as directed. Take azithromycin as directed for the full 5 days. Rest, apply ice intermittently for the next 24 hours followed by heat. Avoid heavy lifting or hard physical activity.  Chest Wall Pain Chest wall pain is pain in or around the bones and muscles of your chest. It may take up to 6 weeks to get better. It may take longer if you must stay physically active in your work and activities.  CAUSES  Chest wall pain may happen on its own. However, it may be caused by:  A viral illness like the flu.  Injury.  Coughing.  Exercise.  Arthritis.  Fibromyalgia.  Shingles. HOME CARE INSTRUCTIONS   Avoid overtiring physical activity. Try not to strain or perform activities that cause pain. This includes any activities using your chest or your abdominal and side muscles, especially if heavy weights are used.  Put ice on the sore area.  Put ice in a plastic bag.  Place a towel between your skin and the bag.  Leave the ice on for 15-20 minutes per hour while awake for the first 2 days.  Only take over-the-counter or prescription medicines for pain, discomfort, or fever as directed by your caregiver. SEEK IMMEDIATE MEDICAL CARE IF:   Your pain increases, or you are very uncomfortable.  You have a fever.  Your chest pain becomes worse.  You have new, unexplained symptoms.  You have nausea or vomiting.  You feel sweaty or lightheaded.  You have a cough with phlegm (sputum), or you cough up blood. MAKE SURE YOU:   Understand these instructions.  Will watch your condition.  Will get help right away if you are not doing well or get worse. Document Released: 04/04/2005 Document  Revised: 06/27/2011 Document Reviewed: 11/29/2010 [facility] Patient Information 2015 [place]. This information is not intended to replace advice given to you by your health care provider. Make sure you discuss any questions you have with your health care provider.  Pneumonia Pneumonia is an infection of the lungs.  CAUSES Pneumonia may be caused by bacteria or a virus. Usually, these infections are caused by breathing infectious particles into the lungs (respiratory tract). SIGNS AND SYMPTOMS   Cough.  Fever.  Chest pain.  Increased rate of breathing.  Wheezing.  Mucus production. DIAGNOSIS  If you have the common symptoms of pneumonia, your health care provider will typically confirm the diagnosis with a chest X-ray. The X-ray will show an abnormality in the lung (pulmonary infiltrate) if you have pneumonia. Other tests of your blood, urine, or sputum may be done to find the specific cause of your pneumonia. Your health care provider may also do tests (blood gases or pulse oximetry) to see how well your lungs are working. TREATMENT  Some forms of pneumonia may be spread to other people when you cough or sneeze. You may be asked to wear a mask before and during your exam. Pneumonia that is caused by bacteria is treated with antibiotic medicine. Pneumonia that is caused by the influenza virus may be treated with an antiviral medicine. Most other viral infections must run their course. These infections will not respond to antibiotics.  HOME CARE INSTRUCTIONS   Cough suppressants may be used if you are losing  too much rest. However, coughing protects you by clearing your lungs. You should avoid using cough suppressants if you can.  Your health care provider may have prescribed medicine if he or she thinks your pneumonia is caused by bacteria or influenza. Finish your medicine even if you start to feel better.  Your health care provider may also prescribe an expectorant. This loosens  the mucus to be coughed up.  Take medicines only as directed by your health care provider.  Do not smoke. Smoking is a common cause of bronchitis and can contribute to pneumonia. If you are a smoker and continue to smoke, your cough may last several weeks after your pneumonia has cleared.  A cold steam vaporizer or humidifier in your room or home may help loosen mucus.  Coughing is often worse at night. Sleeping in a semi-upright position in a recliner or using a couple pillows under your head will help with this.  Get rest as you feel it is needed. Your body will usually let you know when you need to rest. PREVENTION A pneumococcal shot (vaccine) is available to prevent a common bacterial cause of pneumonia. This is usually suggested for:  People over 32 years old.  Patients on chemotherapy.  People with chronic lung problems, such as bronchitis or emphysema.  People with immune system problems. If you are over 65 or have a high risk condition, you may receive the pneumococcal vaccine if you have not received it before. In some countries, a routine influenza vaccine is also recommended. This vaccine can help prevent some cases of pneumonia.You may be offered the influenza vaccine as part of your care. If you smoke, it is time to quit. You may receive instructions on how to stop smoking. Your health care provider can provide medicines and counseling to help you quit. SEEK MEDICAL CARE IF: You have a fever. SEEK IMMEDIATE MEDICAL CARE IF:   Your illness becomes worse. This is especially true if you are elderly or weakened from any other disease.  You cannot control your cough with suppressants and are losing sleep.  You begin coughing up blood.  You develop pain which is getting worse or is uncontrolled with medicines.  Any of the symptoms which initially brought you in for treatment are getting worse rather than better.  You develop shortness of breath or chest pain. MAKE SURE  YOU:   Understand these instructions.  Will watch your condition.  Will get help right away if you are not doing well or get worse. Document Released: 04/04/2005 Document Revised: 08/19/2013 Document Reviewed: 06/24/2010 [facility] Patient Information 2015 [place], [place]. This information is not intended to replace advice given to you by your health care provider. Make sure you discuss any questions you have with your health care provider.

## 2014-05-20 ENCOUNTER — Encounter (HOSPITAL_BASED_OUTPATIENT_CLINIC_OR_DEPARTMENT_OTHER): Payer: Self-pay | Admitting: Emergency Medicine

## 2014-05-20 ENCOUNTER — Emergency Department (HOSPITAL_BASED_OUTPATIENT_CLINIC_OR_DEPARTMENT_OTHER)
Admission: EM | Admit: 2014-05-20 | Discharge: 2014-05-20 | Disposition: A | Discharge status: Home / Self Care | Payer: Medicare Other | Attending: Emergency Medicine | Admitting: Emergency Medicine

## 2014-05-20 ENCOUNTER — Emergency Department (HOSPITAL_BASED_OUTPATIENT_CLINIC_OR_DEPARTMENT_OTHER): Payer: Medicare Other

## 2014-05-20 DIAGNOSIS — G43909 Migraine, unspecified, not intractable, without status migrainosus: Secondary | ICD-10-CM | POA: Insufficient documentation

## 2014-05-20 DIAGNOSIS — Z79899 Other long term (current) drug therapy: Secondary | ICD-10-CM | POA: Diagnosis not present

## 2014-05-20 DIAGNOSIS — Z7951 Long term (current) use of inhaled steroids: Secondary | ICD-10-CM | POA: Insufficient documentation

## 2014-05-20 DIAGNOSIS — R091 Pleurisy: Secondary | ICD-10-CM

## 2014-05-20 DIAGNOSIS — Z791 Long term (current) use of non-steroidal anti-inflammatories (NSAID): Secondary | ICD-10-CM | POA: Insufficient documentation

## 2014-05-20 DIAGNOSIS — Z792 Long term (current) use of antibiotics: Secondary | ICD-10-CM | POA: Diagnosis not present

## 2014-05-20 DIAGNOSIS — I10 Essential (primary) hypertension: Secondary | ICD-10-CM | POA: Diagnosis not present

## 2014-05-20 DIAGNOSIS — Z8659 Personal history of other mental and behavioral disorders: Secondary | ICD-10-CM | POA: Diagnosis not present

## 2014-05-20 DIAGNOSIS — R079 Chest pain, unspecified: Secondary | ICD-10-CM

## 2014-05-20 DIAGNOSIS — R0789 Other chest pain: Secondary | ICD-10-CM | POA: Insufficient documentation

## 2014-05-20 LAB — CBC WITH DIFFERENTIAL/PLATELET
Band Neutrophils: 0 % (ref 0–10)
Basophils Absolute: 0 10*3/uL (ref 0.0–0.1)
Basophils Relative: 0 % (ref 0–1)
Blasts: 0 %
Eosinophils Absolute: 0.2 10*3/uL (ref 0.0–0.7)
Eosinophils Relative: 3 % (ref 0–5)
HCT: 41.1 % (ref 39.0–52.0)
Hemoglobin: 14.3 g/dL (ref 13.0–17.0)
Lymphocytes Relative: 47 % — ABNORMAL HIGH (ref 12–46)
Lymphs Abs: 3.1 10*3/uL (ref 0.7–4.0)
MCH: 30.1 pg (ref 26.0–34.0)
MCHC: 34.8 g/dL (ref 30.0–36.0)
MCV: 86.5 fL (ref 78.0–100.0)
Metamyelocytes Relative: 0 %
Monocytes Absolute: 0.3 10*3/uL (ref 0.1–1.0)
Monocytes Relative: 4 % (ref 3–12)
Myelocytes: 0 %
Neutro Abs: 3 10*3/uL (ref 1.7–7.7)
Neutrophils Relative %: 46 % (ref 43–77)
Platelets: 209 10*3/uL (ref 150–400)
Promyelocytes Absolute: 0 %
RBC: 4.75 MIL/uL (ref 4.22–5.81)
RDW: 12.6 % (ref 11.5–15.5)
WBC: 6.6 10*3/uL (ref 4.0–10.5)
nRBC: 0 /100 WBC

## 2014-05-20 LAB — BASIC METABOLIC PANEL
Anion gap: 4 — ABNORMAL LOW (ref 5–15)
BUN: 9 mg/dL (ref 6–23)
CO2: 27 mmol/L (ref 19–32)
Calcium: 9 mg/dL (ref 8.4–10.5)
Chloride: 107 mmol/L (ref 96–112)
Creatinine, Ser: 0.99 mg/dL (ref 0.50–1.35)
GFR calc Af Amer: >90 mL/min (ref >90)
GFR calc non Af Amer: >90 mL/min (ref >90)
Glucose, Bld: 104 mg/dL — ABNORMAL HIGH (ref 70–99)
Potassium: 3.8 mmol/L (ref 3.5–5.1)
Sodium: 138 mmol/L (ref 135–145)

## 2014-05-20 LAB — TROPONIN I: Troponin I: <0.03 ng/mL (ref <0.031)

## 2014-05-20 MED ORDER — DIAZEPAM 5 MG PO TABS: 5.0000 mg | ORAL_TABLET | Freq: Once | ORAL | Status: DC

## 2014-05-20 NOTE — ED Notes (Signed)
Saline lock removed, tip intact.

## 2014-05-20 NOTE — Discharge Instructions (Signed)

## 2014-05-20 NOTE — ED Provider Notes (Signed)
CSN: [id number]     Arrival date & time 05/20/14  0558 History   None    Chief Complaint  Patient presents with  . Pleurisy     (Consider location/radiation/quality/duration/timing/severity/associated sxs/prior Treatment) Patient is a 49 y.o. male presenting with chest pain.  Chest Pain Pain location:  Substernal area Pain quality: sharp and stabbing   Pain radiates to:  Upper back Pain severity:  Moderate Onset quality:  Gradual Duration:  2 weeks Timing:  Constant Progression:  Unchanged Chronicity:  New Context comment:  Seen here for same, dx with CAP, sent home with abx Relieved by:  Nothing Worsened by:  Coughing, deep breathing and movement Associated symptoms: back pain   Associated symptoms: no abdominal pain, no cough, no fever, no nausea, no shortness of breath and not vomiting     Past Medical History  Diagnosis Date  . Hypertension   . PTSD (post-traumatic stress disorder)   . Migraine    Past Surgical History  Procedure Laterality Date  . Back surgery     History reviewed. No pertinent family history. History  Substance Use Topics  . Smoking status: Never Smoker   . Smokeless tobacco: Not on file  . Alcohol Use: Yes    Review of Systems  Constitutional: Negative for fever.  Respiratory: Negative for cough and shortness of breath.   Cardiovascular: Positive for chest pain.  Gastrointestinal: Negative for nausea, vomiting and abdominal pain.  Musculoskeletal: Positive for back pain.  All other systems reviewed and are negative.     Allergies  Trazodone and nefazodone  Home Medications   Prior to Admission medications   Medication Sig Start Date End Date Taking? Authorizing Provider  amoxicillin (AMOXIL) 500 MG capsule Take 1 capsule (500 mg total) by mouth 3 (three) times daily. 11/15/13   Robyn M Hess, PA-C  azithromycin (ZITHROMAX) 250 MG tablet Take 1 tablet (250 mg total) by mouth daily. Take first 2 tablets together, then 1 every day  until finished. 05/15/14   Robyn M Hess, PA-C  cyclobenzaprine (FLEXERIL) 5 MG tablet Take 1 tablet (5 mg total) by mouth 3 (three) times daily as needed for muscle spasms. 11/29/13   Shon Baton, MD  diazepam (VALIUM) 5 MG tablet Take 1 tablet (5 mg total) by mouth every 12 (twelve) hours as needed for muscle spasms. 05/15/14   Robyn M Hess, PA-C  fluticasone (FLONASE) 50 MCG/ACT nasal spray Place 2 sprays into both nostrils daily. 11/15/13   Robyn M Hess, PA-C  hydrochlorothiazide (HYDRODIURIL) 12.5 MG tablet Take 12.5 mg by mouth daily.    Historical Provider, MD  HYDROcodone-acetaminophen (NORCO/VICODIN) 5-325 MG per tablet Take 1-2 tablets by mouth every 4 (four) hours as needed. 05/15/14   Robyn M Hess, PA-C  HYDROmorphone (DILAUDID) 4 MG tablet Take 1 tablet (4 mg total) by mouth every 4 (four) hours as needed for severe pain. 01/09/14   Carlisle Beers Molpus, MD  ibuprofen (ADVIL,MOTRIN) 800 MG tablet Take 1 tablet (800 mg total) by mouth 3 (three) times daily. 05/15/14   Robyn M Hess, PA-C  lisinopril (PRINIVIL,ZESTRIL) 2.5 MG tablet Take 2.5 mg by mouth daily.    Historical Provider, MD  loratadine (CLARITIN) 10 MG tablet Take 1 tablet (10 mg total) by mouth daily. 11/29/13   Shon Baton, MD  venlafaxine (EFFEXOR) 100 MG tablet Take 250 mg by mouth once.    Historical Provider, MD   BP 148/98 mmHg  Pulse 65  Temp(Src) 98 F (36.7 C) (  Oral)  Resp 16  Ht 5\' 7"  (1.702 m)  Wt 180 lb (81.647 kg)  BMI 28.19 kg/m2  SpO2 98% Physical Exam  Constitutional: He is oriented to person, place, and time. He appears well-developed and well-nourished.  HENT:  Head: Normocephalic and atraumatic.  Eyes: Conjunctivae and EOM are normal.  Neck: Normal range of motion. Neck supple.  Cardiovascular: Normal rate, regular rhythm and normal heart sounds.   Pulmonary/Chest: Effort normal and breath sounds normal. No respiratory distress. He exhibits tenderness and bony tenderness.    Abdominal: He exhibits  no distension. There is no tenderness. There is no rebound and no guarding.  Musculoskeletal: Normal range of motion.       Cervical back: Normal.       Thoracic back: Normal.       Lumbar back: Normal.  Neurological: He is alert and oriented to person, place, and time.  Skin: Skin is warm and dry.  Vitals reviewed.   ED Course  Procedures (including critical care time) Labs Review Labs Reviewed  CBC WITH DIFFERENTIAL/PLATELET - Abnormal; Notable for the following:    Lymphocytes Relative 47 (*)    All other components within normal limits  BASIC METABOLIC PANEL - Abnormal; Notable for the following:    Glucose, Bld 104 (*)    Anion gap 4 (*)    All other components within normal limits  TROPONIN I    Imaging Review Dg Chest Port 1 View  05/20/2014   CLINICAL DATA:  Seven day history of midsternal chest pain. Recent right upper lobe pneumonia  EXAM: PORTABLE CHEST - 1 VIEW  COMPARISON:  May 15, 2014  FINDINGS: The subtle opacity in the right upper lobe noted on recent study is no longer appreciable. Currently lungs are clear. Heart size and pulmonary vascularity are normal. No pneumothorax. No adenopathy. No bone lesions.  IMPRESSION: Lungs now clear.   Electronically Signed   By: Bretta BangWilliam  Woodruff M.D.   On: 05/20/2014 07:06     EKG Interpretation   Date/Time:  Tuesday May 20 2014 06:05:06 EST Ventricular Rate:  66 PR Interval:  162 QRS Duration: 78 QT Interval:  392 QTC Calculation: 410 R Axis:   -13 Text Interpretation:  Sinus rhythm with occasional Premature ventricular  complexes Nonspecific T wave abnormality Abnormal ECG No significant  change since last tracing Confirmed by Mirian MoGentry, Lailoni Baquera [phone]([id number]) on 05/20/2014  6:07:08 AM      MDM   Final diagnoses:  Chest pain, unspecified chest pain type    49 y.o. male with pertinent PMH of back pain, recent CAP sp azithromycin presents with continued chest pain as described above. Pain appear spastic in nature,  worsened by movement and palpation, musculoskeletal in origin.  Physical exam as above with prominent chest wall tenderness.  WU as above, unremarkable.  No new symptoms within last 6 hours, single trop sufficient as pain is unchanged over the last week.  CXR with resolved PNA.  Likely persistent pleurisy vs costochondritis from resolved PNA.  DC home in stable condition with cardiology fu.  I have reviewed all laboratory and imaging studies if ordered as above  1. Chest pain, unspecified chest pain type   2. Pleurisy         Mirian MoMatthew Alyrica Thurow, MD 05/20/14 [phone]

## 2014-10-24 ENCOUNTER — Encounter: Payer: Self-pay | Admitting: Gastroenterology

## 2015-05-01 ENCOUNTER — Emergency Department (HOSPITAL_BASED_OUTPATIENT_CLINIC_OR_DEPARTMENT_OTHER)
Admission: EM | Admit: 2015-05-01 | Discharge: 2015-05-02 | Disposition: A | Discharge status: Home / Self Care | Payer: Medicare Other | Attending: Emergency Medicine | Admitting: Emergency Medicine

## 2015-05-01 ENCOUNTER — Encounter (HOSPITAL_BASED_OUTPATIENT_CLINIC_OR_DEPARTMENT_OTHER): Payer: Self-pay | Admitting: *Deleted

## 2015-05-01 DIAGNOSIS — Z79899 Other long term (current) drug therapy: Secondary | ICD-10-CM | POA: Diagnosis not present

## 2015-05-01 DIAGNOSIS — Z791 Long term (current) use of non-steroidal anti-inflammatories (NSAID): Secondary | ICD-10-CM | POA: Insufficient documentation

## 2015-05-01 DIAGNOSIS — Z792 Long term (current) use of antibiotics: Secondary | ICD-10-CM | POA: Insufficient documentation

## 2015-05-01 DIAGNOSIS — F4310 Post-traumatic stress disorder, unspecified: Secondary | ICD-10-CM | POA: Insufficient documentation

## 2015-05-01 DIAGNOSIS — M545 Low back pain, unspecified: Secondary | ICD-10-CM

## 2015-05-01 DIAGNOSIS — I10 Essential (primary) hypertension: Secondary | ICD-10-CM | POA: Diagnosis not present

## 2015-05-01 DIAGNOSIS — Z9889 Other specified postprocedural states: Secondary | ICD-10-CM | POA: Diagnosis not present

## 2015-05-01 DIAGNOSIS — Z7951 Long term (current) use of inhaled steroids: Secondary | ICD-10-CM | POA: Diagnosis not present

## 2015-05-01 DIAGNOSIS — G8929 Other chronic pain: Secondary | ICD-10-CM | POA: Diagnosis not present

## 2015-05-01 MED ORDER — OXYCODONE-ACETAMINOPHEN 5-325 MG PO TABS: 1.0000 | ORAL_TABLET | Freq: Four times a day (QID) | ORAL | Status: DC | PRN

## 2015-05-01 MED ORDER — HYDROMORPHONE HCL 1 MG/ML IJ SOLN
2.0000 mg | Freq: Once | INTRAMUSCULAR | Status: AC
  Administered 2015-05-02: 2 mg via INTRAMUSCULAR
  Filled 2015-05-01: qty 2

## 2015-05-01 NOTE — ED Notes (Signed)
Back pain for a week. Hx of back problems.

## 2015-05-01 NOTE — ED Provider Notes (Signed)
CSN: [id number]   Arrival date & time 05/01/15 2136  History  By signing my name below, I, Bethel Born, attest that this documentation has been prepared under the direction and in the presence of Geoffery Lyons, MD. Electronically Signed: Bethel Born, ED Scribe. 05/01/2015. 11:31 PM.  Chief Complaint  Patient presents with  . Back Pain    HPI The history is provided by the patient. No language interpreter was used.   Alex [name] is a 50 y.o. male with history of chronic back pain and discectomy who presents to the Emergency Department complaining of constant, 9/10 in severity,  left lower back pain with sudden onset 7 days ago after sneezing. Heat ice, and Motrin have provided insufficient relief in pain at home. Pt reports that he has been ambulatory but with increased pain.  He states that was on 2.5 mg of Percocet daily but recently discontinued it. Pt denies incontinence of bowel or bladder. Most of his care is at the [place] and he states that he recently had an MRI.   Past Medical History  Diagnosis Date  . Hypertension   . PTSD (post-traumatic stress disorder)   . Migraine     Past Surgical History  Procedure Laterality Date  . Back surgery      No family history on file.  Social History  Substance Use Topics  . Smoking status: Never Smoker   . Smokeless tobacco: None  . Alcohol Use: Yes     Review of Systems 10 Systems reviewed and all are negative for acute change except as noted in the HPI. Home Medications   Prior to Admission medications   Medication Sig Start Date End Date Taking? Authorizing Provider  amoxicillin (AMOXIL) 500 MG capsule Take 1 capsule (500 mg total) by mouth 3 (three) times daily. 11/15/13   Robyn M Hess, PA-C  azithromycin (ZITHROMAX) 250 MG tablet Take 1 tablet (250 mg total) by mouth daily. Take first 2 tablets together, then 1 every day until finished. 05/15/14   Robyn M Hess, PA-C  cyclobenzaprine (FLEXERIL) 5 MG tablet Take 1 tablet (5 mg  total) by mouth 3 (three) times daily as needed for muscle spasms. 11/29/13   Shon Baton, MD  diazepam (VALIUM) 5 MG tablet Take 1 tablet (5 mg total) by mouth every 12 (twelve) hours as needed for muscle spasms. 05/15/14   Robyn M Hess, PA-C  fluticasone (FLONASE) 50 MCG/ACT nasal spray Place 2 sprays into both nostrils daily. 11/15/13   Robyn M Hess, PA-C  hydrochlorothiazide (HYDRODIURIL) 12.5 MG tablet Take 12.5 mg by mouth daily.    Historical Provider, MD  HYDROcodone-acetaminophen (NORCO/VICODIN) 5-325 MG per tablet Take 1-2 tablets by mouth every 4 (four) hours as needed. 05/15/14   Robyn M Hess, PA-C  HYDROmorphone (DILAUDID) 4 MG tablet Take 1 tablet (4 mg total) by mouth every 4 (four) hours as needed for severe pain. 01/09/14   John Molpus, MD  ibuprofen (ADVIL,MOTRIN) 800 MG tablet Take 1 tablet (800 mg total) by mouth 3 (three) times daily. 05/15/14   Robyn M Hess, PA-C  lisinopril (PRINIVIL,ZESTRIL) 2.5 MG tablet Take 2.5 mg by mouth daily.    Historical Provider, MD  loratadine (CLARITIN) 10 MG tablet Take 1 tablet (10 mg total) by mouth daily. 11/29/13   Shon Baton, MD  venlafaxine (EFFEXOR) 100 MG tablet Take 250 mg by mouth once.    Historical Provider, MD    Allergies  Trazodone and nefazodone  Triage Vitals: BP 174/115  mmHg  Pulse 92  Temp(Src) 98.3 F (36.8 C) (Oral)  Resp 20  Ht 5\' 7"  (1.702 m)  Wt 180 lb (81.647 kg)  BMI 28.19 kg/m2  SpO2 99%  Physical Exam  Constitutional: He is oriented to person, place, and time. He appears well-developed and well-nourished.  HENT:  Head: Normocephalic and atraumatic.  Eyes: EOM are normal.  Neck: Normal range of motion.  Cardiovascular: Normal rate, regular rhythm, normal heart sounds and intact distal pulses.   Pulmonary/Chest: Effort normal and breath sounds normal. No respiratory distress.  Abdominal: Soft. He exhibits no distension. There is no tenderness.  Musculoskeletal: Normal range of motion.  TTP in the  soft tissues of the left lower lumbar region.   Neurological: He is alert and oriented to person, place, and time.  DTRs are 2+ and symmetrical in the BLE.  Strength is 5/5 in the BLE.  Ambulates without difficulty.   Skin: Skin is warm and dry.  Psychiatric: He has a normal mood and affect. Judgment normal.  Nursing note and vitals reviewed.   ED Course  Procedures   DIAGNOSTIC STUDIES: Oxygen Saturation is 99% on RA, normal by my interpretation.    COORDINATION OF CARE: 11:28 PM Discussed treatment plan which includes pain management with pt at bedside and pt agreed to plan.  Labs Reviewed - No data to display  Imaging Review No results found.    MDM   Final diagnoses:  Left-sided low back pain without sciatica    Patient with history of chronic low back pain. He has been off his pain medications for several months, however his pain has worsened. He denies any bowel or bladder complaints. He denies any new injury or trauma. He denies any weakness or numbness. His physical examination reveals no focal deficits and nothing in his exam or history with suggest an emergent situation. I will prescribe a small quantity of Percocet and he will be discharged to home. To follow-up with his primary Dr. or pain management doctor for further refills.  I personally performed the services described in this documentation, which was scribed in my presence. The recorded information has been reviewed and is accurate.       Geoffery Lyonsouglas Oleg Oleson, MD 05/02/15 [phone]

## 2015-05-01 NOTE — Discharge Instructions (Signed)
Ibuprofen 600 mg 3 times daily for the next 5 days.  Percocet as prescribed as needed for pain not relieved with ibuprofen.  Follow-up with your primary Dr. if not improving in the next week and for future refills of your pain medication.   Back Pain, Adult Back pain is very common in adults.The cause of back pain is rarely dangerous and the pain often gets better over time.The cause of your back pain may not be known. Some common causes of back pain include:  Strain of the muscles or ligaments supporting the spine.  Wear and tear (degeneration) of the spinal disks.  Arthritis.  Direct injury to the back. For many people, back pain may return. Since back pain is rarely dangerous, most people can learn to manage this condition on their own. HOME CARE INSTRUCTIONS Watch your back pain for any changes. The following actions may help to lessen any discomfort you are feeling:  Remain active. It is stressful on your back to sit or stand in one place for long periods of time. Do not sit, drive, or stand in one place for more than 30 minutes at a time. Take short walks on even surfaces as soon as you are able.Try to increase the length of time you walk each day.  Exercise regularly as directed by your health care provider. Exercise helps your back heal faster. It also helps avoid future injury by keeping your muscles strong and flexible.  Do not stay in bed.Resting more than 1-2 days can delay your recovery.  Pay attention to your body when you bend and lift. The most comfortable positions are those that put less stress on your recovering back. Always use proper lifting techniques, including:  Bending your knees.  Keeping the load close to your body.  Avoiding twisting.  Find a comfortable position to sleep. Use a firm mattress and lie on your side with your knees slightly bent. If you lie on your back, put a pillow under your knees.  Avoid feeling anxious or stressed.Stress  increases muscle tension and can worsen back pain.It is important to recognize when you are anxious or stressed and learn ways to manage it, such as with exercise.  Take medicines only as directed by your health care provider. Over-the-counter medicines to reduce pain and inflammation are often the most helpful.Your health care provider may prescribe muscle relaxant drugs.These medicines help dull your pain so you can more quickly return to your normal activities and healthy exercise.  Apply ice to the injured area:  Put ice in a plastic bag.  Place a towel between your skin and the bag.  Leave the ice on for 20 minutes, 2-3 times a day for the first 2-3 days. After that, ice and heat may be alternated to reduce pain and spasms.  Maintain a healthy weight. Excess weight puts extra stress on your back and makes it difficult to maintain good posture. SEEK MEDICAL CARE IF:  You have pain that is not relieved with rest or medicine.  You have increasing pain going down into the legs or buttocks.  You have pain that does not improve in one week.  You have night pain.  You lose weight.  You have a fever or chills. SEEK IMMEDIATE MEDICAL CARE IF:   You develop new bowel or bladder control problems.  You have unusual weakness or numbness in your arms or legs.  You develop nausea or vomiting.  You develop abdominal pain.  You feel faint.  This information is not intended to replace advice given to you by your health care provider. Make sure you discuss any questions you have with your health care provider.   Document Released: 04/04/2005 Document Revised: 04/25/2014 Document Reviewed: 08/06/2013 Elsevier Interactive Patient Education [employer].

## 2015-08-23 ENCOUNTER — Emergency Department (HOSPITAL_BASED_OUTPATIENT_CLINIC_OR_DEPARTMENT_OTHER)
Admission: EM | Admit: 2015-08-23 | Discharge: 2015-08-23 | Disposition: A | Discharge status: Home / Self Care | Payer: Medicare Other | Attending: Emergency Medicine | Admitting: Emergency Medicine

## 2015-08-23 ENCOUNTER — Encounter (HOSPITAL_BASED_OUTPATIENT_CLINIC_OR_DEPARTMENT_OTHER): Payer: Self-pay

## 2015-08-23 DIAGNOSIS — M545 Low back pain: Secondary | ICD-10-CM | POA: Diagnosis not present

## 2015-08-23 DIAGNOSIS — I10 Essential (primary) hypertension: Secondary | ICD-10-CM | POA: Insufficient documentation

## 2015-08-23 DIAGNOSIS — Z79899 Other long term (current) drug therapy: Secondary | ICD-10-CM | POA: Insufficient documentation

## 2015-08-23 MED ORDER — OXYCODONE-ACETAMINOPHEN 5-325 MG PO TABS: 1.0000 | ORAL_TABLET | Freq: Four times a day (QID) | ORAL | Status: DC | PRN

## 2015-08-23 MED ORDER — CYCLOBENZAPRINE HCL 10 MG PO TABS: 10.0000 mg | ORAL_TABLET | Freq: Every day | ORAL | Status: DC

## 2015-08-23 MED ORDER — HYDROMORPHONE HCL 1 MG/ML IJ SOLN
1.0000 mg | Freq: Once | INTRAMUSCULAR | Status: AC
  Administered 2015-08-23: 1 mg via INTRAMUSCULAR
  Filled 2015-08-23: qty 1

## 2015-08-23 MED ORDER — PREDNISONE 50 MG PO TABS: 50.0000 mg | ORAL_TABLET | Freq: Every day | ORAL | Status: DC

## 2015-08-23 NOTE — Discharge Instructions (Signed)
Return here as needed.  Follow-up with your primary care doctor.  Use ice and heat on your lower back °

## 2015-08-23 NOTE — ED Notes (Signed)
Back pain,states has been been doing a lot of lifting

## 2015-08-23 NOTE — ED Notes (Signed)
No loss of bowel or urine tone per pt statement

## 2015-08-23 NOTE — ED Provider Notes (Signed)
CSN: [id number]     Arrival date & time 08/23/15  1049 History   First MD Initiated Contact with Patient 08/23/15 1106     Chief Complaint  Patient presents with  . Back Pain     (Consider location/radiation/quality/duration/timing/severity/associated sxs/prior Treatment) HPI Patient presents to the emergency department with an exacerbation of his chronic lower back pain.  The patient states that he was doing a lot of lifting yesterday and last night noticed he had worsening lower back pain.  He states that his back will flare up from time to time.  He does not have chronic every day pain.  Patient states that nothing seems make the condition better, but movement and palpation make the pain worse.  The patient states that he did not take any medications prior to arrival other than Motrin, which did not seem  dysuria, hematemesis, bloody stool, near syncope, or syncope. Past Medical History  Diagnosis Date  . Hypertension   . PTSD (post-traumatic stress disorder)   . Migraine    Past Surgical History  Procedure Laterality Date  . Back surgery     No family history on file. Social History  Substance Use Topics  . Smoking status: Never Smoker   . Smokeless tobacco: None  . Alcohol Use: Yes    Review of Systems  All other systems negative except as documented in the HPI. All pertinent positives and negatives as reviewed in the HPI.  Allergies  Trazodone and nefazodone  Home Medications   Prior to Admission medications   Medication Sig Start Date End Date Taking? Authorizing Provider  amoxicillin (AMOXIL) 500 MG capsule Take 1 capsule (500 mg total) by mouth 3 (three) times daily. 11/15/13   Robyn M Hess, PA-C  azithromycin (ZITHROMAX) 250 MG tablet Take 1 tablet (250 mg total) by mouth daily. Take first 2 tablets together, then 1 every day until finished. 05/15/14   Robyn M Hess, PA-C  cyclobenzaprine (FLEXERIL) 5 MG tablet Take 1 tablet (5 mg total) by mouth 3 (three) times daily  as needed for muscle spasms. 11/29/13   Shon Batonourtney F Horton, MD  diazepam (VALIUM) 5 MG tablet Take 1 tablet (5 mg total) by mouth every 12 (twelve) hours as needed for muscle spasms. 05/15/14   Robyn M Hess, PA-C  fluticasone (FLONASE) 50 MCG/ACT nasal spray Place 2 sprays into both nostrils daily. 11/15/13   Robyn M Hess, PA-C  hydrochlorothiazide (HYDRODIURIL) 12.5 MG tablet Take 12.5 mg by mouth daily.    Historical Provider, MD  HYDROcodone-acetaminophen (NORCO/VICODIN) 5-325 MG per tablet Take 1-2 tablets by mouth every 4 (four) hours as needed. 05/15/14   Robyn M Hess, PA-C  HYDROmorphone (DILAUDID) 4 MG tablet Take 1 tablet (4 mg total) by mouth every 4 (four) hours as needed for severe pain. 01/09/14   John Molpus, MD  ibuprofen (ADVIL,MOTRIN) 800 MG tablet Take 1 tablet (800 mg total) by mouth 3 (three) times daily. 05/15/14   Robyn M Hess, PA-C  lisinopril (PRINIVIL,ZESTRIL) 2.5 MG tablet Take 2.5 mg by mouth daily.    Historical Provider, MD  loratadine (CLARITIN) 10 MG tablet Take 1 tablet (10 mg total) by mouth daily. 11/29/13   Shon Batonourtney F Horton, MD  oxyCODONE-acetaminophen (PERCOCET) 5-325 MG tablet Take 1-2 tablets by mouth every 6 (six) hours as needed. 05/01/15   Geoffery Lyonsouglas Delo, MD  venlafaxine (EFFEXOR) 100 MG tablet Take 250 mg by mouth once.    Historical Provider, MD   BP 137/98 mmHg  Pulse 70  Temp(Src) 98.9 F (37.2 C) (Oral)  Resp 20  Ht  (1.702 m)  Wt 89.812 kg  BMI 31.00 kg/m2  SpO2 98% Physical Exam  Constitutional: He is oriented to person, place, and time. He appears well-developed and well-nourished. No distress.  HENT:  Head: Normocephalic and atraumatic.  Eyes: Pupils are equal, round, and reactive to light.  Neck: Normal range of motion. Neck supple.  Cardiovascular: Normal rate, regular rhythm and normal heart sounds.  Exam reveals no gallop and no friction rub.   No murmur heard. Pulmonary/Chest: Effort normal and breath sounds normal. No respiratory  distress. He has no wheezes.  Musculoskeletal:       Lumbar back: He exhibits tenderness and pain. He exhibits normal range of motion, no bony tenderness, no swelling, no deformity and no spasm.  Neurological: He is alert and oriented to person, place, and time. He has normal reflexes. He exhibits normal muscle tone. Coordination normal.  Skin: Skin is warm and dry. No rash noted. No erythema.  Psychiatric: He has a normal mood and affect. His behavior is normal.  Nursing note and vitals reviewed.   ED Course  Procedures (including critical care time) Labs Review Labs Reviewed - No data to display  Imaging Review No results found. I have personally reviewed and evaluated these images and lab results as part of my medical decision-making.   EKG Interpretation None      MDM   Final diagnoses:  None      Patient be treated for his low back pain.  Told to return here as needed.  Patient agrees the plan and all questions were answered  Charlestine Night, PA-C 08/23/15 1143  Leta Baptist, MD 08/27/15 [phone]

## 2015-08-23 NOTE — ED Notes (Signed)
Pt reports chronic lower back pain since last night. Denies injury. Denies urinary symptoms.

## 2015-09-07 ENCOUNTER — Encounter (HOSPITAL_BASED_OUTPATIENT_CLINIC_OR_DEPARTMENT_OTHER): Payer: Self-pay | Admitting: *Deleted

## 2015-09-07 ENCOUNTER — Emergency Department (HOSPITAL_BASED_OUTPATIENT_CLINIC_OR_DEPARTMENT_OTHER)
Admission: EM | Admit: 2015-09-07 | Discharge: 2015-09-07 | Disposition: A | Discharge status: Home / Self Care | Payer: Medicare Other | Attending: Emergency Medicine | Admitting: Emergency Medicine

## 2015-09-07 DIAGNOSIS — M545 Low back pain: Secondary | ICD-10-CM | POA: Diagnosis present

## 2015-09-07 DIAGNOSIS — M5431 Sciatica, right side: Secondary | ICD-10-CM | POA: Insufficient documentation

## 2015-09-07 DIAGNOSIS — I10 Essential (primary) hypertension: Secondary | ICD-10-CM | POA: Diagnosis not present

## 2015-09-07 MED ORDER — OXYCODONE-ACETAMINOPHEN 5-325 MG PO TABS: 1.0000 | ORAL_TABLET | Freq: Four times a day (QID) | ORAL | Status: DC | PRN

## 2015-09-07 MED ORDER — HYDROMORPHONE HCL 1 MG/ML IJ SOLN
2.0000 mg | Freq: Once | INTRAMUSCULAR | Status: AC
  Administered 2015-09-07: 2 mg via INTRAMUSCULAR
  Filled 2015-09-07: qty 2

## 2015-09-07 NOTE — ED Notes (Signed)
md with pt

## 2015-09-07 NOTE — ED Provider Notes (Signed)
CSN: [id number]     Arrival date & time 09/07/15  0344 History   First MD Initiated Contact with Patient 09/07/15 0421     Chief Complaint  Patient presents with  . Back Pain     (Consider location/radiation/quality/duration/timing/severity/associated sxs/prior Treatment) HPI  This is a 50 year old male with a history of sciatica. He is here with right-sided low back pain radiating to his right buttock. The pain began yesterday morning and he spent the day resting. His back pain acutely worsened this morning awakening him from sleep. He describes it as severe and worse with movement of the lower back or hips. He denies numbness or weakness. He denies trauma or heavy lifting recently. He has an appointment at the [facility] today for surgical consultation for this complaint.  Past Medical History  Diagnosis Date  . Hypertension   . PTSD (post-traumatic stress disorder)   . Migraine    Past Surgical History  Procedure Laterality Date  . Back surgery     No family history on file. Social History  Substance Use Topics  . Smoking status: Never Smoker   . Smokeless tobacco: None  . Alcohol Use: Yes     Comment: occasional     Review of Systems  All other systems reviewed and are negative.   Allergies  Trazodone and nefazodone  Home Medications   Prior to Admission medications   Medication Sig Start Date End Date Taking? Authorizing Provider  amoxicillin (AMOXIL) 500 MG capsule Take 1 capsule (500 mg total) by mouth 3 (three) times daily. 11/15/13   Robyn M Hess, PA-C  azithromycin (ZITHROMAX) 250 MG tablet Take 1 tablet (250 mg total) by mouth daily. Take first 2 tablets together, then 1 every day until finished. 05/15/14   Robyn M Hess, PA-C  cyclobenzaprine (FLEXERIL) 10 MG tablet Take 1 tablet (10 mg total) by mouth at bedtime. 08/23/15   Christopher Lawyer, PA-C  diazepam (VALIUM) 5 MG tablet Take 1 tablet (5 mg total) by mouth every 12 (twelve) hours as needed for muscle spasms.  05/15/14   Robyn M Hess, PA-C  fluticasone (FLONASE) 50 MCG/ACT nasal spray Place 2 sprays into both nostrils daily. 11/15/13   Robyn M Hess, PA-C  hydrochlorothiazide (HYDRODIURIL) 12.5 MG tablet Take 12.5 mg by mouth daily.    Historical Provider, MD  HYDROcodone-acetaminophen (NORCO/VICODIN) 5-325 MG per tablet Take 1-2 tablets by mouth every 4 (four) hours as needed. 05/15/14   Robyn M Hess, PA-C  HYDROmorphone (DILAUDID) 4 MG tablet Take 1 tablet (4 mg total) by mouth every 4 (four) hours as needed for severe pain. 01/09/14   Alyne Martinson, MD  ibuprofen (ADVIL,MOTRIN) 800 MG tablet Take 1 tablet (800 mg total) by mouth 3 (three) times daily. 05/15/14   Robyn M Hess, PA-C  lisinopril (PRINIVIL,ZESTRIL) 2.5 MG tablet Take 2.5 mg by mouth daily.    Historical Provider, MD  loratadine (CLARITIN) 10 MG tablet Take 1 tablet (10 mg total) by mouth daily. 11/29/13   Shon Baton, MD  oxyCODONE-acetaminophen (PERCOCET/ROXICET) 5-325 MG tablet Take 1 tablet by mouth every 6 (six) hours as needed for severe pain. 08/23/15   Charlestine Night, PA-C  predniSONE (DELTASONE) 50 MG tablet Take 1 tablet (50 mg total) by mouth daily. 08/23/15   Charlestine Night, PA-C  venlafaxine (EFFEXOR) 100 MG tablet Take 250 mg by mouth once.    Historical Provider, MD   BP 132/94 mmHg  Pulse 59  Temp(Src) 98 F (36.7 C)  Resp 18  Ht 5\' 7"  (1.702 m)  Wt 190 lb (86.183 kg)  BMI 29.75 kg/m2  SpO2 98%   Physical Exam  General: Well-developed, well-nourished male in no acute distress; appearance consistent with age of record HENT: normocephalic; atraumatic Eyes: pupils equal, round and reactive to light; extraocular muscles intact Neck: supple Heart: regular rate and rhythm Lungs: clear to auscultation bilaterally Abdomen: soft; nondistended; nontender; bowel sounds present Back: Right paralumbar tenderness; positive straight leg raise bilaterally at 45 Extremities: No deformity; full range of motion; pulses  normal Neurologic: Awake, alert and oriented; motor function intact in all extremities and symmetric; no facial droop; sensation intact and symmetric in the lower extremities Skin: Warm and dry Psychiatric: Normal mood and affect    ED Course  Procedures (including critical care time)   MDM     Paula LibraJohn Tyronn Golda, MD 09/07/15 [phone]

## 2015-09-07 NOTE — ED Notes (Signed)
Ginger ale given per pt request. 

## 2015-09-07 NOTE — ED Notes (Signed)
Pt c/o lower back pain that started last 24 hours that radiates into his right buttocks. Denies any recent injury, but states he [facility].  Able to walk but painful. Denies any issues with urination.

## 2015-09-07 NOTE — ED Notes (Signed)
Pt ambulated to car per his request. RN walked with pt. States he feels better. Friend transported pt home.

## 2015-09-28 IMAGING — CR DG CHEST 2V
2 series · 2 of 2 positions shown · non-contrast
Comparison: Portable chest x-ray March 17, 2010

CLINICAL DATA: Intermittent midsternal pain for the past 4 days

EXAM:
CHEST  2 VIEW

[w chest pa]
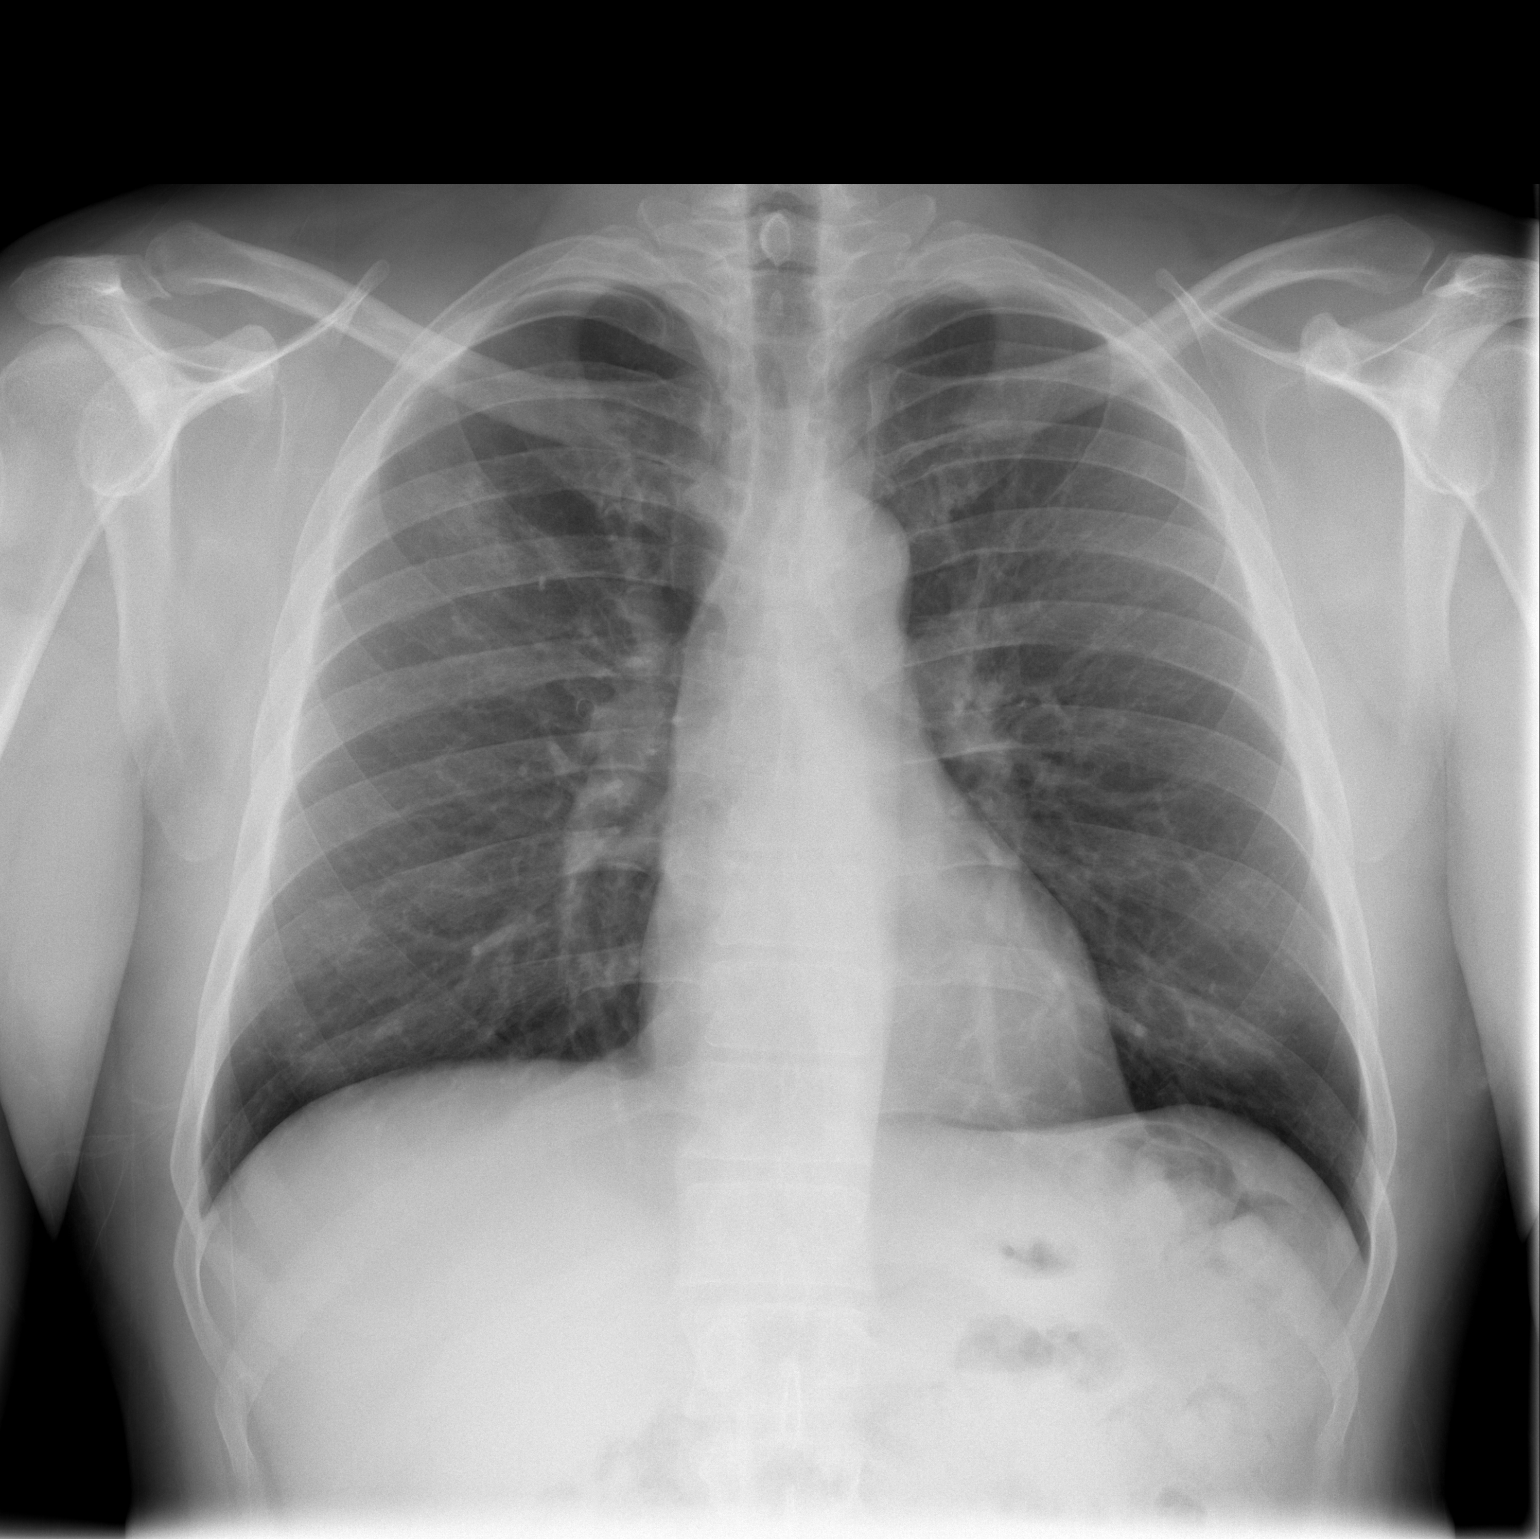

[w chest lat]
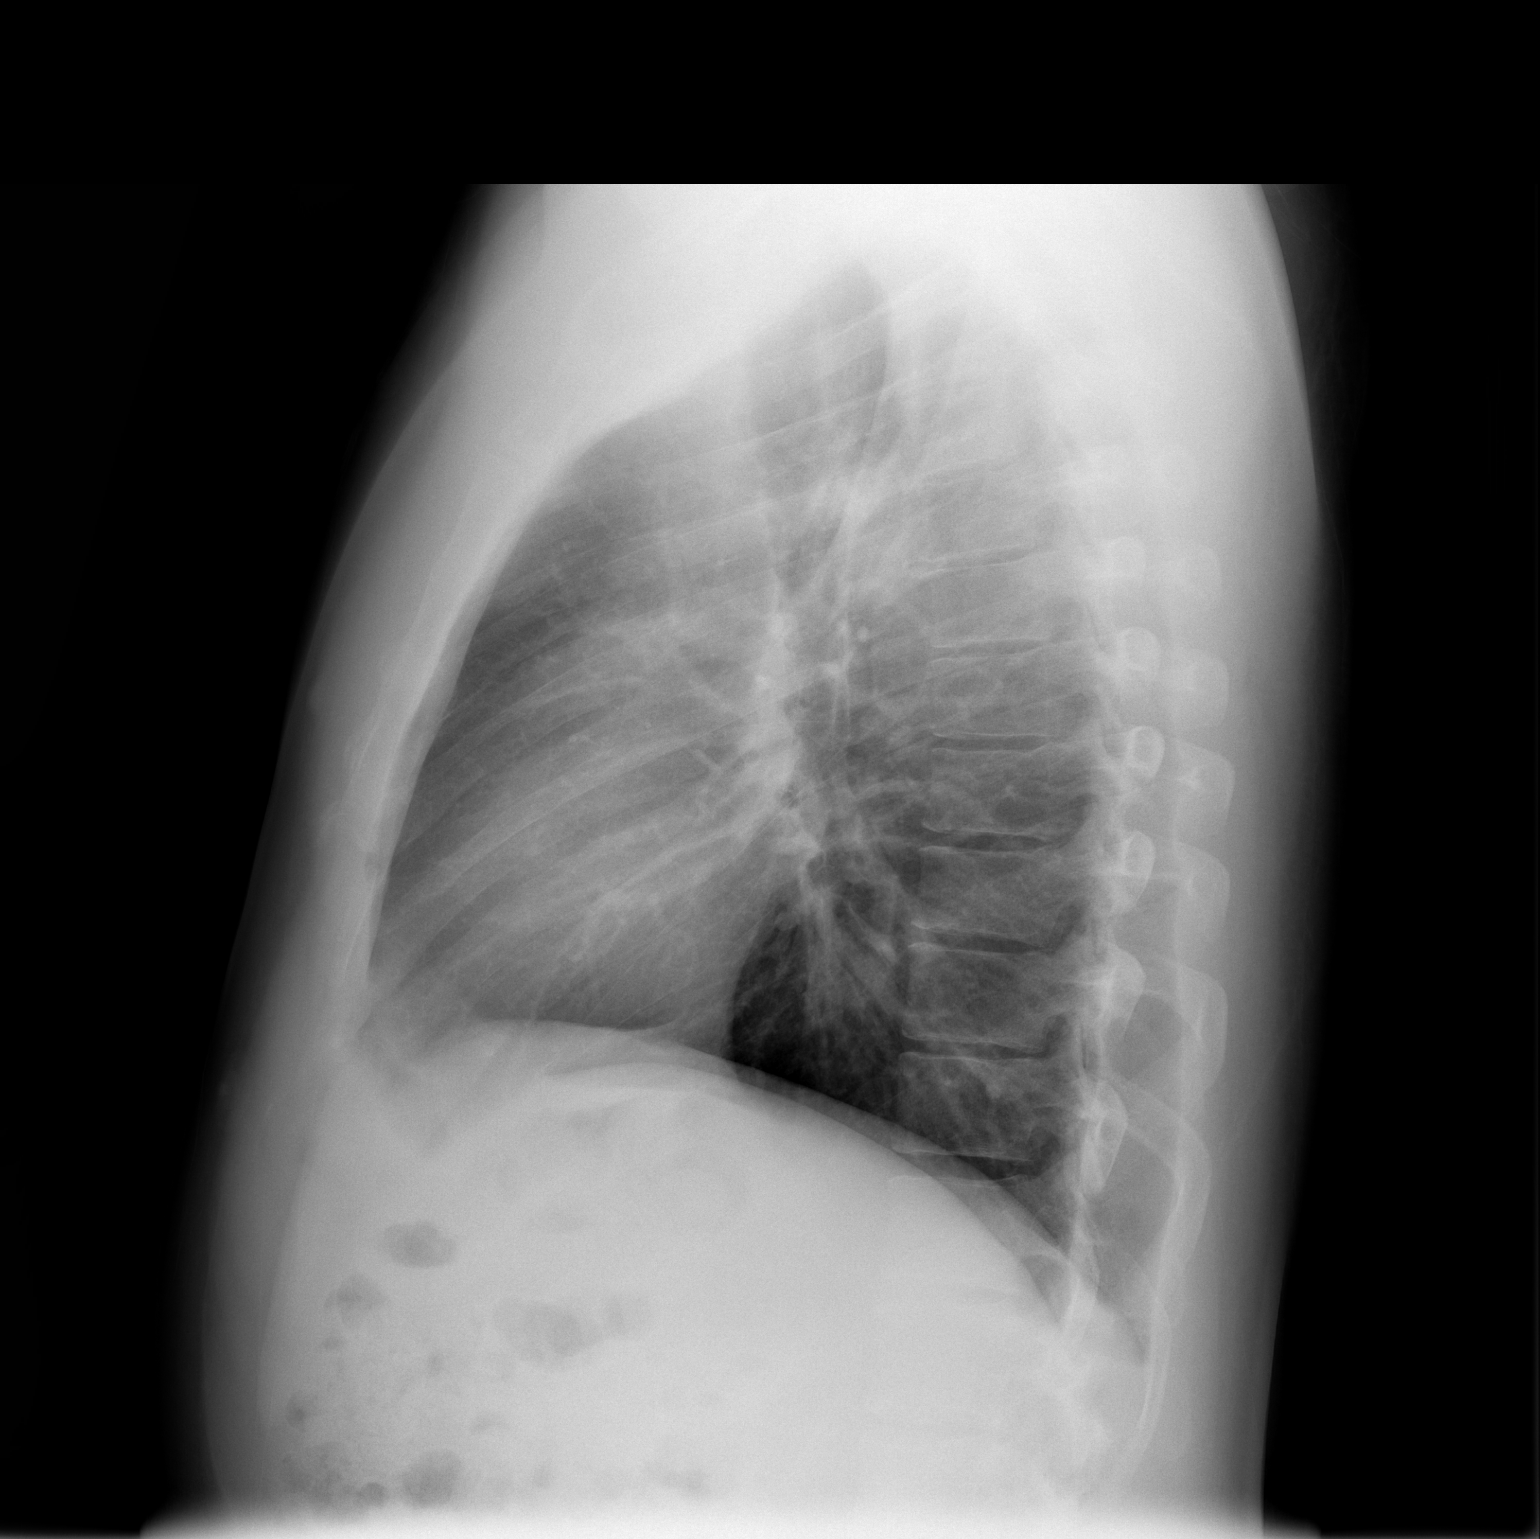

[2 of 2 positions shown; findings below may reference images not displayed]

FINDINGS: The lungs are adequately inflated. There is hazy increased
parenchymal density in the right upper lobe. The heart and pulmonary
vascularity are normal. The mediastinum is normal in width. There is
no pleural effusion or pneumothorax. The bony thorax is
unremarkable.
IMPRESSION: Ill-defined alveolar opacity in the right upper lobe is consistent
with early pneumonia. Follow-up radiographs following anticipated
antibiotic therapy are recommended to assure clearing.

## 2015-10-12 ENCOUNTER — Emergency Department (HOSPITAL_BASED_OUTPATIENT_CLINIC_OR_DEPARTMENT_OTHER)
Admission: EM | Admit: 2015-10-12 | Discharge: 2015-10-12 | Disposition: A | Discharge status: Home / Self Care | Payer: Medicare Other | Attending: Emergency Medicine | Admitting: Emergency Medicine

## 2015-10-12 ENCOUNTER — Encounter (HOSPITAL_BASED_OUTPATIENT_CLINIC_OR_DEPARTMENT_OTHER): Payer: Self-pay | Admitting: *Deleted

## 2015-10-12 DIAGNOSIS — R51 Headache: Secondary | ICD-10-CM | POA: Insufficient documentation

## 2015-10-12 DIAGNOSIS — I10 Essential (primary) hypertension: Secondary | ICD-10-CM | POA: Diagnosis not present

## 2015-10-12 DIAGNOSIS — M545 Low back pain, unspecified: Secondary | ICD-10-CM

## 2015-10-12 MED ORDER — HYDROMORPHONE HCL 1 MG/ML IJ SOLN
1.0000 mg | Freq: Once | INTRAMUSCULAR | Status: AC
  Administered 2015-10-12: 1 mg via INTRAMUSCULAR
  Filled 2015-10-12: qty 1

## 2015-10-12 NOTE — ED Notes (Signed)
Pt reports chronic back pain with increase x Friday, has been taking motrin without relief. Pt states "I also have a headache."
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
## 2015-10-12 NOTE — Discharge Instructions (Signed)
I recommend continuing to take 600 mg ibuprofen 4 times daily as an for pain relief. You may also apply ice and/or heat to affected area for 15-20 minutes 3-4 times daily as needed for pain. Refrain from doing any heavy lifting, bending or exacerbating movements until your pain has improved. I recommend following up with your primary care provider within the next week if your symptoms have not improved. Follow-up with your neurosurgeon at your scheduled appointment for further management of your chronic back pain. Please return to the Emergency Department if symptoms worsen or new onset of fever, numbness, tingling, groin numbness, abdominal pain, urinary retention,, loss of bowel or bladder, weakness.

## 2015-10-12 NOTE — ED Notes (Signed)
PA at bedside.

## 2015-11-26 ENCOUNTER — Emergency Department (HOSPITAL_BASED_OUTPATIENT_CLINIC_OR_DEPARTMENT_OTHER)
Admission: EM | Admit: 2015-11-26 | Discharge: 2015-11-26 | Disposition: A | Discharge status: Home / Self Care | Payer: Medicare Other | Attending: Emergency Medicine | Admitting: Emergency Medicine

## 2015-11-26 ENCOUNTER — Encounter (HOSPITAL_BASED_OUTPATIENT_CLINIC_OR_DEPARTMENT_OTHER): Payer: Self-pay | Admitting: *Deleted

## 2015-11-26 DIAGNOSIS — M549 Dorsalgia, unspecified: Secondary | ICD-10-CM | POA: Diagnosis present

## 2015-11-26 DIAGNOSIS — Z791 Long term (current) use of non-steroidal anti-inflammatories (NSAID): Secondary | ICD-10-CM | POA: Diagnosis not present

## 2015-11-26 DIAGNOSIS — M5442 Lumbago with sciatica, left side: Secondary | ICD-10-CM | POA: Diagnosis not present

## 2015-11-26 DIAGNOSIS — Z79899 Other long term (current) drug therapy: Secondary | ICD-10-CM | POA: Diagnosis not present

## 2015-11-26 DIAGNOSIS — I10 Essential (primary) hypertension: Secondary | ICD-10-CM | POA: Diagnosis not present

## 2015-11-26 MED ORDER — NAPROXEN 500 MG PO TABS: 500.0000 mg | ORAL_TABLET | Freq: Two times a day (BID) | ORAL | 0 refills | Status: DC

## 2015-11-26 MED ORDER — HYDROMORPHONE HCL 2 MG/ML IJ SOLN
2.0000 mg | Freq: Once | INTRAMUSCULAR | Status: DC
  Filled 2015-11-26: qty 1

## 2015-11-26 MED ORDER — CYCLOBENZAPRINE HCL 10 MG PO TABS: 10.0000 mg | ORAL_TABLET | Freq: Two times a day (BID) | ORAL | 0 refills | Status: DC | PRN

## 2015-11-26 MED ORDER — HYDROMORPHONE HCL 1 MG/ML IJ SOLN
INTRAMUSCULAR | Status: AC
  Administered 2015-11-26: 2 mg
  Filled 2015-11-26: qty 2

## 2015-11-26 MED FILL — NAPROXEN 500 MG TABLET: 500 | 7 days supply | Qty: 14 | Fill #0

## 2015-11-26 MED FILL — CYCLOBENZAPRINE 10 MG TAB: 10 | 10 days supply | Qty: 20 | Fill #0

## 2015-11-26 NOTE — ED Triage Notes (Signed)
C/o back pain since Sunday. States he has chronic back pain. C/o lower left side back pain and going down left leg. No recent injury.

## 2015-11-26 NOTE — Discharge Instructions (Signed)
Take medications as directed. Take the Naprosyn for the next 7 days. Take the felxeril to help relax the back. Return for any new or worse symptoms.

## 2015-11-26 NOTE — ED Notes (Signed)
Pt wanted to stand for IM injection. After pt was given shot he sat in chair and crossed his legs and started looking at his phone.

## 2015-11-26 NOTE — ED Provider Notes (Signed)
[facility] Provider Note   CSN: [id number] Arrival date & time: 11/26/15  [id number]  First Provider Contact:  First MD Initiated Contact with Patient 11/26/15 [phone]        History   Chief Complaint Chief Complaint  Patient presents with  . Back Pain    HPI Alex [name] is a 50 y.o. male.  Patient with a history of chronic back pain. Patient states is been exacerbation of that the pain that started on Sunday. Left-sided goes down the back of the left leg to the level of the knee. No numbness or weakness in the left leg. No incontinence. Patient states that she is usually about benefited by an IM injection of hydromorphone here. It usually settles things down. Pain is 8 out of 10. Worse with movement.      Past Medical History:  Diagnosis Date  . Hypertension   . Migraine   . PTSD (post-traumatic stress disorder)     Patient Active Problem List   Diagnosis Date Noted  . HEMORRHOIDS-INTERNAL 08/18/2009  . BLOOD IN STOOL-MELENA 08/18/2009  . PERSONAL HX COLONIC POLYPS 08/18/2009  . RECTAL BLEEDING 05/28/2009  . OSTEOARTHRITIS 05/28/2009  . DEPRESSION 05/22/2009  . HYPERTENSION 05/22/2009  . LOW BACK PAIN 05/22/2009  . HEADACHE 05/22/2009  . RECTAL BLEEDING, HX OF 05/22/2009    Past Surgical History:  Procedure Laterality Date  . BACK SURGERY         Home Medications    Prior to Admission medications   Medication Sig Start Date End Date Taking? Authorizing Provider  fluticasone (FLONASE) 50 MCG/ACT nasal spray Place 2 sprays into both nostrils daily. 11/15/13  Yes Robyn M Hess, PA-C  hydrochlorothiazide (HYDRODIURIL) 12.5 MG tablet Take 12.5 mg by mouth daily.   Yes Historical Provider, MD  ibuprofen (ADVIL,MOTRIN) 800 MG tablet Take 1 tablet (800 mg total) by mouth 3 (three) times daily. 05/15/14  Yes Robyn M Hess, PA-C  lisinopril (PRINIVIL,ZESTRIL) 2.5 MG tablet Take 2.5 mg by mouth daily.   Yes Historical Provider, MD  loratadine (CLARITIN) 10 MG  tablet Take 1 tablet (10 mg total) by mouth daily. 11/29/13  Yes Shon Batonourtney F Horton, MD  venlafaxine (EFFEXOR) 100 MG tablet Take 250 mg by mouth once.   Yes Historical Provider, MD  cyclobenzaprine (FLEXERIL) 10 MG tablet Take 1 tablet (10 mg total) by mouth 2 (two) times daily as needed for muscle spasms. 11/26/15   Vanetta MuldersScott Greer Koeppen, MD  naproxen (NAPROSYN) 500 MG tablet Take 1 tablet (500 mg total) by mouth 2 (two) times daily. 11/26/15   Vanetta MuldersScott Daran Favaro, MD    Family History No family history on file.  Social History Social History  Substance Use Topics  . Smoking status: Never Smoker  . Smokeless tobacco: Never Used  . Alcohol use Yes     Comment: occasional      Allergies   Trazodone and nefazodone   Review of Systems Review of Systems  Constitutional: Negative for fever.  HENT: Negative for congestion.   Respiratory: Negative for shortness of breath.   Cardiovascular: Negative for chest pain.  Gastrointestinal: Negative for abdominal pain.  Genitourinary: Negative for difficulty urinating.  Musculoskeletal: Positive for back pain.  Neurological: Negative for weakness and numbness.  Hematological: Does not bruise/bleed easily.  Psychiatric/Behavioral: Negative for confusion.     Physical Exam Updated Vital Signs BP (!) 142/104 (BP Location: Right Arm)   Pulse 60   Temp 98 F (36.7 C) (Oral)   Resp 18  Ht  (1.702 m)   Wt 86.2 kg   SpO2 100%   BMI 29.76 kg/m   Physical Exam  Constitutional: He is oriented to person, place, and time. He appears well-developed and well-nourished. No distress.  HENT:  Head: Normocephalic and atraumatic.  Eyes: EOM are normal. Pupils are equal, round, and reactive to light.  Neck: Normal range of motion. Neck supple.  Cardiovascular: Normal rate, regular rhythm and normal heart sounds.   Pulmonary/Chest: Effort normal and breath sounds normal.  Abdominal: Soft. Bowel sounds are normal.  Musculoskeletal: Normal range of  motion.  Neurological: He is alert and oriented to person, place, and time. He displays normal reflexes. No cranial nerve deficit. He exhibits normal muscle tone.  Nursing note and vitals reviewed.    ED Treatments / Results  Labs (all labs ordered are listed, but only abnormal results are displayed) Labs Reviewed - No data to display  EKG  EKG Interpretation None       Radiology No results found.  Procedures Procedures (including critical care time)  Medications Ordered in ED Medications  HYDROmorphone (DILAUDID) injection 2 mg (not administered)  HYDROmorphone (DILAUDID) 1 MG/ML injection (2 mg  Given 11/26/15 0854)     Initial Impression / Assessment and Plan / ED Course  I have reviewed the triage vital signs and the nursing notes.  Pertinent labs & imaging results that were available during my care of the patient were reviewed by me and considered in my medical decision making (see chart for details).  Clinical Course     Final Clinical Impressions(s) / ED Diagnoses   Final diagnoses:  Left-sided low back pain with left-sided sciatica    New Prescriptions New Prescriptions   CYCLOBENZAPRINE (FLEXERIL) 10 MG TABLET    Take 1 tablet (10 mg total) by mouth 2 (two) times daily as needed for muscle spasms.   NAPROXEN (NAPROSYN) 500 MG TABLET    Take 1 tablet (500 mg total) by mouth 2 (two) times daily.    Patient with history of chronic back pain. Patient with exacerbation of left-sided low back pain with radiation of pain down to the knee back of the leg suggestive of sciatica. However no neuro focal deficits no numbness to the left foot. No incontinence. Patient does states that usually IM injection of hydromorphone helps get that back pain under control and that's when he presented for today. Patient was significant improvement with that. He'll be continued on Flexeril and Naprosyn.    Vanetta Mulders, MD 11/26/15 [phone]

## 2016-03-02 ENCOUNTER — Emergency Department (HOSPITAL_BASED_OUTPATIENT_CLINIC_OR_DEPARTMENT_OTHER)
Admission: EM | Admit: 2016-03-02 | Discharge: 2016-03-02 | Disposition: A | Discharge status: Home / Self Care | Payer: Medicare Other | Attending: Physician Assistant | Admitting: Physician Assistant

## 2016-03-02 ENCOUNTER — Emergency Department (HOSPITAL_BASED_OUTPATIENT_CLINIC_OR_DEPARTMENT_OTHER): Payer: Medicare Other

## 2016-03-02 ENCOUNTER — Encounter (HOSPITAL_BASED_OUTPATIENT_CLINIC_OR_DEPARTMENT_OTHER): Payer: Self-pay

## 2016-03-02 DIAGNOSIS — Z79899 Other long term (current) drug therapy: Secondary | ICD-10-CM | POA: Diagnosis not present

## 2016-03-02 DIAGNOSIS — J189 Pneumonia, unspecified organism: Secondary | ICD-10-CM | POA: Diagnosis not present

## 2016-03-02 DIAGNOSIS — Z791 Long term (current) use of non-steroidal anti-inflammatories (NSAID): Secondary | ICD-10-CM | POA: Insufficient documentation

## 2016-03-02 DIAGNOSIS — I10 Essential (primary) hypertension: Secondary | ICD-10-CM | POA: Insufficient documentation

## 2016-03-02 DIAGNOSIS — R05 Cough: Secondary | ICD-10-CM | POA: Diagnosis present

## 2016-03-02 MED ORDER — IBUPROFEN 800 MG PO TABS: 800.0000 mg | ORAL_TABLET | Freq: Three times a day (TID) | ORAL | 0 refills | Status: DC

## 2016-03-02 MED ORDER — IBUPROFEN 800 MG PO TABS
800.0000 mg | ORAL_TABLET | Freq: Once | ORAL | Status: AC
  Administered 2016-03-02: 800 mg via ORAL
  Filled 2016-03-02: qty 1

## 2016-03-02 MED ORDER — AZITHROMYCIN 250 MG PO TABS: 250.0000 mg | ORAL_TABLET | Freq: Once | ORAL | 0 refills | Status: AC

## 2016-03-02 MED ORDER — AZITHROMYCIN 250 MG PO TABS
500.0000 mg | ORAL_TABLET | Freq: Once | ORAL | Status: AC
  Administered 2016-03-02: 500 mg via ORAL
  Filled 2016-03-02: qty 2

## 2016-03-02 MED ORDER — GUAIFENESIN-CODEINE 100-10 MG/5ML PO SOLN
5.0000 mL | Freq: Four times a day (QID) | ORAL | 0 refills | Status: DC | PRN
Start: 2016-03-02 — End: 2016-09-26

## 2016-03-02 NOTE — ED Notes (Signed)
Patient transported to X-ray 

## 2016-03-02 NOTE — ED Provider Notes (Signed)
[facility] Provider Note   CSN: [id number] Arrival date & time: 03/02/16  1831  By signing my name below, I, [name], attest that this documentation has been prepared under the direction and in the presence of physician practitioner, Avenly Roberge Randall AnLyn Traycen Goyer, MD. Electronically Signed: Linna Darnerussell Turner, Scribe. 03/02/2016. 8:13 PM.  History   Chief Complaint Chief Complaint  Patient presents with  . Cough    The history is provided by the patient. No language interpreter was used.     HPI Comments: Alex [name] is a 50 y.o. male who presents to the Emergency Department complaining of constant flu-like symptoms beginning 3 days ago. He endorses cough, congestion, generalized myalgias, and rhinorrhea; he notes his cough worsened severely today. Pt notes a h/p pneumonia x7 and states his current symptoms feel the same. He did not have a flu shot this year. He denies nausea, vomiting, sore throat, or any other associated symptoms.  Past Medical History:  Diagnosis Date  . Hypertension   . Migraine   . PTSD (post-traumatic stress disorder)     Patient Active Problem List   Diagnosis Date Noted  . HEMORRHOIDS-INTERNAL 08/18/2009  . BLOOD IN STOOL-MELENA 08/18/2009  . PERSONAL HX COLONIC POLYPS 08/18/2009  . RECTAL BLEEDING 05/28/2009  . OSTEOARTHRITIS 05/28/2009  . DEPRESSION 05/22/2009  . HYPERTENSION 05/22/2009  . LOW BACK PAIN 05/22/2009  . HEADACHE 05/22/2009  . RECTAL BLEEDING, HX OF 05/22/2009    Past Surgical History:  Procedure Laterality Date  . BACK SURGERY         Home Medications    Prior to Admission medications   Medication Sig Start Date End Date Taking? Authorizing Provider  azithromycin (ZITHROMAX Z-PAK) 250 MG tablet Take 1 tablet (250 mg total) by mouth once. 03/02/16 03/02/16  Rotha Cassels Lyn Hildreth Orsak, MD  cyclobenzaprine (FLEXERIL) 10 MG tablet Take 1 tablet (10 mg total) by mouth 2 (two) times daily as needed for muscle spasms.  11/26/15   Vanetta MuldersScott Zackowski, MD  guaiFENesin-codeine 100-10 MG/5ML syrup Take 5 mLs by mouth every 6 (six) hours as needed for cough. 03/02/16   Brevan Luberto Lyn Westyn Driggers, MD  hydrochlorothiazide (HYDRODIURIL) 12.5 MG tablet Take 12.5 mg by mouth daily.    Historical Provider, MD  ibuprofen (ADVIL,MOTRIN) 800 MG tablet Take 1 tablet (800 mg total) by mouth 3 (three) times daily. 05/15/14   Robyn M Hess, PA-C  ibuprofen (ADVIL,MOTRIN) 800 MG tablet Take 1 tablet (800 mg total) by mouth 3 (three) times daily. 03/02/16   Jens Siems Lyn Caellum Mancil, MD  lisinopril (PRINIVIL,ZESTRIL) 2.5 MG tablet Take 2.5 mg by mouth daily.    Historical Provider, MD  loratadine (CLARITIN) 10 MG tablet Take 1 tablet (10 mg total) by mouth daily. 11/29/13   Shon Batonourtney F Horton, MD  naproxen (NAPROSYN) 500 MG tablet Take 1 tablet (500 mg total) by mouth 2 (two) times daily. 11/26/15   Vanetta MuldersScott Zackowski, MD  venlafaxine (EFFEXOR) 100 MG tablet Take 250 mg by mouth once.    Historical Provider, MD    Family History No family history on file.  Social History Social History  Substance Use Topics  . Smoking status: Never Smoker  . Smokeless tobacco: Never Used  . Alcohol use Yes     Comment: occasional      Allergies   Trazodone and nefazodone   Review of Systems Review of Systems  HENT: Positive for congestion and rhinorrhea. Negative for sore throat.   Respiratory: Positive for cough.   Gastrointestinal: Negative for nausea  and vomiting.  Musculoskeletal: Positive for myalgias (generalized).  All other systems reviewed and are negative.   Physical Exam Updated Vital Signs BP 132/85   Pulse 89   Temp 100.7 F (38.2 C) (Oral)   Resp 18   Ht 5\' 7"  (1.702 m)   Wt 190 lb (86.2 kg)   SpO2 100%   BMI 29.76 kg/m   Physical Exam  Constitutional: He is oriented to person, place, and time. He appears well-developed and well-nourished. No distress.  HENT:  Head: Normocephalic and atraumatic.  Nasal turbinates show  runny nose but no erythema.  Eyes: Conjunctivae and EOM are normal.  Neck: Neck supple. No tracheal deviation present.  Cardiovascular: Normal rate and regular rhythm.   Pulmonary/Chest: Effort normal. No respiratory distress. He has no wheezes. He has no rales. He exhibits no tenderness.  Lungs CTA.  Musculoskeletal: Normal range of motion.  Neurological: He is alert and oriented to person, place, and time.  Skin: Skin is warm and dry.  Psychiatric: He has a normal mood and affect. His behavior is normal.  Nursing note and vitals reviewed.   ED Treatments / Results  Labs (all labs ordered are listed, but only abnormal results are displayed) Labs Reviewed - No data to display  EKG  EKG Interpretation None       Radiology Dg Chest 2 View  Result Date: 03/02/2016 CLINICAL DATA:  Acute onset of generalized chest pain, cough and shortness of breath. Initial encounter. EXAM: CHEST  2 VIEW COMPARISON:  Chest radiograph performed 05/20/2014 FINDINGS: The lungs are well-aerated. Minimal right midlung opacity could reflect mild infection. There is no evidence of pleural effusion or pneumothorax. The heart is normal in size; the mediastinal contour is within normal limits. No acute osseous abnormalities are seen. IMPRESSION: Minimal right midlung opacity could reflect mild infection. Electronically Signed   By: Roanna RaiderJeffery  Chang M.D.   On: 03/02/2016 20:23    Procedures Procedures (including critical care time)  DIAGNOSTIC STUDIES: Oxygen Saturation is 100% on RA, normal by my interpretation.    COORDINATION OF CARE: 8:17 PM Discussed treatment plan with pt at bedside and pt agreed to plan.  Medications Ordered in ED Medications  azithromycin (ZITHROMAX) tablet 500 mg (500 mg Oral Given 03/02/16 2041)  ibuprofen (ADVIL,MOTRIN) tablet 800 mg (800 mg Oral Given 03/02/16 2041)     Initial Impression / Assessment and Plan / ED Course  I have reviewed the triage vital signs and the  nursing notes.  Pertinent labs & imaging results that were available during my care of the patient were reviewed by me and considered in my medical decision making (see chart for details).  Clinical Course     Patient's 50 year old male presenting with cough myalgias, fever, runny nose. Patient has history of multiple episodes of pneumonia. He thinks is here with the same. Chest x-ray shows pneumonia. We'll treat with Z-Pak considering that its community acquired. We'll give patient symptomatic care and have him follow-up with primary care physician for repeat x-ray to 4 weeks.  Vital signs are normal and patient is taking by mouth without issue.  Patient is comfortable, ambulatory, and taking PO at time of discharge.  Patient expressed understanding about return precautions.    I personally performed the services described in this documentation, which was scribed in my presence. The recorded information has been reviewed and is accurate.     Final Clinical Impressions(s) / ED Diagnoses   Final diagnoses:  Community acquired pneumonia, unspecified laterality  New Prescriptions New Prescriptions   AZITHROMYCIN (ZITHROMAX Z-PAK) 250 MG TABLET    Take 1 tablet (250 mg total) by mouth once.   GUAIFENESIN-CODEINE 100-10 MG/5ML SYRUP    Take 5 mLs by mouth every 6 (six) hours as needed for cough.   IBUPROFEN (ADVIL,MOTRIN) 800 MG TABLET    Take 1 tablet (800 mg total) by mouth 3 (three) times daily.     Lora Glomski Randall An, MD 03/02/16 2150

## 2016-03-02 NOTE — Discharge Instructions (Signed)
It looks that youy have pneumonia. Please take the antibiotics as prescribed. Please follow-up for repeat chest x-ray in 2-4 weeks with her primary care physician.  To find a primary care or specialty doctor please call [phone] or [phone] to access "[facility] Find a Doctor Service."  You may also go on the [facility] website at [email or web address]  There are also multiple [facility], [place] and [facility] practices throughout the [place] that are frequently accepting new patients. You may find a clinic that is close to your home and contact them.  [facility] -  [address] [place] [id number] [phone]  [facility] in [place] (also locations in [place] and [place]) -  [address] [place] [address] [phone]  [facility] -  [address] [place] [address] [phone]

## 2016-03-02 NOTE — ED Triage Notes (Signed)
C/o cough since Sunday-NAD-steady gait

## 2016-04-15 ENCOUNTER — Encounter (HOSPITAL_BASED_OUTPATIENT_CLINIC_OR_DEPARTMENT_OTHER): Payer: Self-pay | Admitting: *Deleted

## 2016-04-15 ENCOUNTER — Emergency Department (HOSPITAL_BASED_OUTPATIENT_CLINIC_OR_DEPARTMENT_OTHER)
Admission: EM | Admit: 2016-04-15 | Discharge: 2016-04-15 | Disposition: A | Discharge status: Home / Self Care | Payer: Medicare Other | Attending: Emergency Medicine | Admitting: Emergency Medicine

## 2016-04-15 DIAGNOSIS — M545 Low back pain: Secondary | ICD-10-CM | POA: Diagnosis present

## 2016-04-15 DIAGNOSIS — I10 Essential (primary) hypertension: Secondary | ICD-10-CM | POA: Insufficient documentation

## 2016-04-15 DIAGNOSIS — Z79899 Other long term (current) drug therapy: Secondary | ICD-10-CM | POA: Diagnosis not present

## 2016-04-15 DIAGNOSIS — M5442 Lumbago with sciatica, left side: Secondary | ICD-10-CM | POA: Diagnosis not present

## 2016-04-15 DIAGNOSIS — G8929 Other chronic pain: Secondary | ICD-10-CM

## 2016-04-15 HISTORY — DX: Other chronic pain: G89.29

## 2016-04-15 HISTORY — DX: Dorsalgia, unspecified: M54.9

## 2016-04-15 MED ORDER — ONDANSETRON 4 MG PO TBDP
4.0000 mg | ORAL_TABLET | Freq: Once | ORAL | Status: AC
  Administered 2016-04-15: 4 mg via ORAL
  Filled 2016-04-15 (×2): qty 1

## 2016-04-15 MED ORDER — HYDROMORPHONE HCL 1 MG/ML IJ SOLN
1.0000 mg | Freq: Once | INTRAMUSCULAR | Status: AC
  Administered 2016-04-15: 1 mg via INTRAMUSCULAR
  Filled 2016-04-15: qty 1

## 2016-04-15 MED ORDER — KETOROLAC TROMETHAMINE 60 MG/2ML IM SOLN
60.0000 mg | Freq: Once | INTRAMUSCULAR | Status: AC
  Administered 2016-04-15: 60 mg via INTRAMUSCULAR
  Filled 2016-04-15: qty 2

## 2016-04-15 NOTE — ED Notes (Addendum)
Pt is sitting in w/c. No distress. Waiting in w/a for treatment room. He does not have a ride.

## 2016-04-15 NOTE — ED Triage Notes (Signed)
Lower back pain. Hx of chronic pain.

## 2016-04-15 NOTE — ED Notes (Signed)
Patient dropped zofran on the floor, this nurse had to remove another one from the pyxis.

## 2016-04-15 NOTE — Discharge Instructions (Signed)
Please read and follow all provided instructions.  Your diagnoses today include:  1. Chronic bilateral low back pain with left-sided sciatica     Tests performed today include:  Vital signs - see below for your results today  Medications prescribed:   None  Take any prescribed medications only as directed.  Home care instructions:   Follow any educational materials contained in this packet  Please rest, use ice or heat on your back for the next several days  Do not lift, push, pull anything more than 10 pounds for the next week  Follow-up instructions: Please follow-up with your primary care provider in the next 1 week for further evaluation of your symptoms.   Return instructions:  SEEK IMMEDIATE MEDICAL ATTENTION IF YOU HAVE:  New numbness, tingling, weakness, or problem with the use of your arms or legs  Severe back pain not relieved with medications  Loss control of your bowels or bladder  Increasing pain in any areas of the body (such as chest or abdominal pain)  Shortness of breath, dizziness, or fainting.   Worsening nausea (feeling sick to your stomach), vomiting, fever, or sweats  Any other emergent concerns regarding your health   Additional Information:  Your vital signs today were: BP 123/81 (BP Location: Right Arm)    Pulse 60    Temp 98.2 F (36.8 C) (Oral)    Resp 18    Ht 5\' 7"  (1.702 m)    Wt 86.2 kg    SpO2 97%    BMI 29.76 kg/m  If your blood pressure (BP) was elevated above 135/85 this visit, please have this repeated by your doctor within one month. --------------

## 2016-04-15 NOTE — ED Provider Notes (Signed)
[facility] Provider Note   CSN: [id number] Arrival date & time: 04/15/16  1924   By signing my name below, I, Clovis PuAvnee Patel, attest that this documentation has been prepared under the direction and in the presence of  RaytheonJosh Masoud Nyce PA-C. Electronically Signed: Clovis PuAvnee Patel, ED Scribe. 04/15/16. 9:38 PM.   History   Chief Complaint Chief Complaint  Patient presents with  . Back Pain   The history is provided by the patient. No language interpreter was used.   HPI Comments:  Alex [name] is a 50 y.o. male , with a hx of chronic back pain and PSHx of back surgery, who presents to the Emergency Department complaining of constant, moderate lower back pain x several days. Pt notes his pain radiates to his upper left leg and is worse with movement and ambulation. He has taken motrin and applied an ice pack with no relief. Pt denies fevers, weight loss, bladder/bowel incontinence, abdominal pain, any other associated symptoms and any other modifying factors at this time.    Past Medical History:  Diagnosis Date  . Chronic back pain   . Hypertension   . Migraine   . PTSD (post-traumatic stress disorder)     Patient Active Problem List   Diagnosis Date Noted  . HEMORRHOIDS-INTERNAL 08/18/2009  . BLOOD IN STOOL-MELENA 08/18/2009  . PERSONAL HX COLONIC POLYPS 08/18/2009  . RECTAL BLEEDING 05/28/2009  . OSTEOARTHRITIS 05/28/2009  . DEPRESSION 05/22/2009  . HYPERTENSION 05/22/2009  . LOW BACK PAIN 05/22/2009  . HEADACHE 05/22/2009  . RECTAL BLEEDING, HX OF 05/22/2009    Past Surgical History:  Procedure Laterality Date  . BACK SURGERY         Home Medications    Prior to Admission medications   Medication Sig Start Date End Date Taking? Authorizing Provider  cyclobenzaprine (FLEXERIL) 10 MG tablet Take 1 tablet (10 mg total) by mouth 2 (two) times daily as needed for muscle spasms. 11/26/15   Vanetta MuldersScott Zackowski, MD  guaiFENesin-codeine 100-10 MG/5ML syrup Take 5 mLs by  mouth every 6 (six) hours as needed for cough. 03/02/16   Courteney Lyn Mackuen, MD  hydrochlorothiazide (HYDRODIURIL) 12.5 MG tablet Take 12.5 mg by mouth daily.    Historical Provider, MD  ibuprofen (ADVIL,MOTRIN) 800 MG tablet Take 1 tablet (800 mg total) by mouth 3 (three) times daily. 05/15/14   Robyn M Hess, PA-C  ibuprofen (ADVIL,MOTRIN) 800 MG tablet Take 1 tablet (800 mg total) by mouth 3 (three) times daily. 03/02/16   Courteney Lyn Mackuen, MD  lisinopril (PRINIVIL,ZESTRIL) 2.5 MG tablet Take 2.5 mg by mouth daily.    Historical Provider, MD  loratadine (CLARITIN) 10 MG tablet Take 1 tablet (10 mg total) by mouth daily. 11/29/13   Shon Batonourtney F Horton, MD  naproxen (NAPROSYN) 500 MG tablet Take 1 tablet (500 mg total) by mouth 2 (two) times daily. 11/26/15   Vanetta MuldersScott Zackowski, MD  venlafaxine (EFFEXOR) 100 MG tablet Take 250 mg by mouth once.    Historical Provider, MD    Family History No family history on file.  Social History Social History  Substance Use Topics  . Smoking status: Never Smoker  . Smokeless tobacco: Never Used  . Alcohol use Yes     Comment: occasional      Allergies   Trazodone and nefazodone   Review of Systems Review of Systems  Constitutional: Negative for fever and unexpected weight change.  Gastrointestinal: Negative for abdominal pain and constipation.  Neg for fecal incontinence  Genitourinary: Negative for difficulty urinating, flank pain and hematuria.       Negative for urinary incontinence or retention  Musculoskeletal: Positive for back pain and myalgias.  Neurological: Negative for weakness and numbness.       Negative for saddle paresthesias    Physical Exam Updated Vital Signs BP 123/81 (BP Location: Right Arm)   Pulse 60   Temp 98.2 F (36.8 C) (Oral)   Resp 18   Ht 5\' 7"  (1.702 m)   Wt 190 lb (86.2 kg)   SpO2 97%   BMI 29.76 kg/m   Physical Exam  Constitutional: He is oriented to person, place, and time. He appears  well-developed and well-nourished. No distress.  HENT:  Head: Normocephalic and atraumatic.  Eyes: Conjunctivae are normal.  Neck: Normal range of motion.  Cardiovascular: Normal rate.   Pulmonary/Chest: Effort normal.  Abdominal: Soft. He exhibits no distension. There is no tenderness. There is no CVA tenderness.  Musculoskeletal: Normal range of motion.       Cervical back: He exhibits normal range of motion, no tenderness and no bony tenderness.       Thoracic back: He exhibits normal range of motion, no tenderness and no bony tenderness.       Lumbar back: He exhibits tenderness. He exhibits normal range of motion and no bony tenderness.  No step-off noted with palpation of spine.   Neurological: He is alert and oriented to person, place, and time. He has normal reflexes. No sensory deficit. He exhibits normal muscle tone.  5/5 strength in entire lower extremities bilaterally. No sensation deficit. Antalgic gait without foot drop.   Skin: Skin is warm and dry.  Psychiatric: He has a normal mood and affect.  Nursing note and vitals reviewed.  ED Treatments / Results  DIAGNOSTIC STUDIES:  Oxygen Saturation is 97% on RA, normal by my interpretation.    COORDINATION OF CARE:  9:32 PM Will order and administer 1 IM Dilaudid, Toradol and Zofran. Advised pt to continue home medications. Discussed treatment plan with pt at bedside and pt agreed to plan.  Procedures Procedures (including critical care time)  Medications Ordered in ED Medications - No data to display   Initial Impression / Assessment and Plan / ED Course  I have reviewed the triage vital signs and the nursing notes.  Pertinent labs & imaging results that were available during my care of the patient were reviewed by me and considered in my medical decision making (see chart for details).  Clinical Course    Vital signs reviewed and are as follows: Vitals:   04/15/16 1929  BP: 123/81  Pulse: 60  Resp: 18    Temp: 98.2 F (36.8 C)   Patient with chronic back pain and this seems to be typical flare. No neurological deficits and normal neuro exam. Patient is ambulatory. No loss of bowel or bladder control. No concern for cauda equina. No fever, night sweats, weight loss, h/o cancer, IVDA, no recent procedure to back. No urinary symptoms suggestive of UTI. Supportive care and return precaution discussed. Appears safe for discharge at this time. Follow up as indicated in discharge paperwork.   Final Clinical Impressions(s) / ED Diagnoses   Final diagnoses:  Chronic bilateral low back pain with left-sided sciatica    New Prescriptions New Prescriptions   No medications on file  I personally performed the services described in this documentation, which was scribed in my presence. The recorded information has  been reviewed and is accurate.     Renne CriglerJoshua Nur Krasinski, PA-C 04/15/16 2157    Laurence Spatesachel Morgan Little, MD 04/15/16 [id number]

## 2016-04-18 ENCOUNTER — Emergency Department (HOSPITAL_BASED_OUTPATIENT_CLINIC_OR_DEPARTMENT_OTHER)
Admission: EM | Admit: 2016-04-18 | Discharge: 2016-04-18 | Disposition: A | Discharge status: Home / Self Care | Payer: Medicare Other | Attending: Emergency Medicine | Admitting: Emergency Medicine

## 2016-04-18 ENCOUNTER — Encounter (HOSPITAL_BASED_OUTPATIENT_CLINIC_OR_DEPARTMENT_OTHER): Payer: Self-pay | Admitting: *Deleted

## 2016-04-18 DIAGNOSIS — Z79899 Other long term (current) drug therapy: Secondary | ICD-10-CM | POA: Insufficient documentation

## 2016-04-18 DIAGNOSIS — M545 Low back pain, unspecified: Secondary | ICD-10-CM

## 2016-04-18 DIAGNOSIS — I10 Essential (primary) hypertension: Secondary | ICD-10-CM | POA: Insufficient documentation

## 2016-04-18 DIAGNOSIS — G8929 Other chronic pain: Secondary | ICD-10-CM | POA: Insufficient documentation

## 2016-04-18 DIAGNOSIS — Z791 Long term (current) use of non-steroidal anti-inflammatories (NSAID): Secondary | ICD-10-CM | POA: Insufficient documentation

## 2016-04-18 MED ORDER — HYDROMORPHONE HCL 1 MG/ML IJ SOLN
2.0000 mg | Freq: Once | INTRAMUSCULAR | Status: AC
  Administered 2016-04-18: 2 mg via INTRAMUSCULAR
  Filled 2016-04-18: qty 2

## 2016-04-18 MED ORDER — KETOROLAC TROMETHAMINE 60 MG/2ML IM SOLN
60.0000 mg | Freq: Once | INTRAMUSCULAR | Status: AC
  Administered 2016-04-18: 60 mg via INTRAMUSCULAR
  Filled 2016-04-18: qty 2

## 2016-04-18 MED ORDER — CYCLOBENZAPRINE HCL 10 MG PO TABS
10.0000 mg | ORAL_TABLET | Freq: Once | ORAL | Status: AC
  Administered 2016-04-18: 10 mg via ORAL
  Filled 2016-04-18: qty 1

## 2016-04-18 NOTE — ED Provider Notes (Signed)
[facility]   CSN: [id number] Arrival date & time: 04/18/16  1211     History   Chief Complaint Chief Complaint  Patient presents with  . Back Pain    HPI Celene SquibbSean Lall is a 51 y.o. male.  HPI 51 yo M with history of chronic back pain who p/w ongoing back pain. Pt has known, chronic back pain and sees [facility] for this. He was last seen 2 days ago for similar sx. He states That since then, he has had progressively worsening aching, throbbing, midline and bilateral lower back pain. The pain is similar to his chronic pain in distribution but worse in severity. Denies any recent falls. Denies any new numbness or weakness. No loss of bowel or bladder function. Denies any fevers or chills. No history of IV drug use. No recent fevers.  Past Medical History:  Diagnosis Date  . Chronic back pain   . Hypertension   . Migraine   . PTSD (post-traumatic stress disorder)     Patient Active Problem List   Diagnosis Date Noted  . HEMORRHOIDS-INTERNAL 08/18/2009  . BLOOD IN STOOL-MELENA 08/18/2009  . PERSONAL HX COLONIC POLYPS 08/18/2009  . RECTAL BLEEDING 05/28/2009  . OSTEOARTHRITIS 05/28/2009  . DEPRESSION 05/22/2009  . HYPERTENSION 05/22/2009  . LOW BACK PAIN 05/22/2009  . HEADACHE 05/22/2009  . RECTAL BLEEDING, HX OF 05/22/2009    Past Surgical History:  Procedure Laterality Date  . BACK SURGERY         Home Medications    Prior to Admission medications   Medication Sig Start Date End Date Taking? Authorizing Provider  cyclobenzaprine (FLEXERIL) 10 MG tablet Take 1 tablet (10 mg total) by mouth 2 (two) times daily as needed for muscle spasms. 11/26/15  Yes Vanetta MuldersScott Zackowski, MD  guaiFENesin-codeine 100-10 MG/5ML syrup Take 5 mLs by mouth every 6 (six) hours as needed for cough. 03/02/16  Yes Courteney Lyn Mackuen, MD  hydrochlorothiazide (HYDRODIURIL) 12.5 MG tablet Take 12.5 mg by mouth daily.   Yes Historical Provider, MD  ibuprofen (ADVIL,MOTRIN) 800 MG  tablet Take 1 tablet (800 mg total) by mouth 3 (three) times daily. 05/15/14  Yes Robyn M Hess, PA-C  lisinopril (PRINIVIL,ZESTRIL) 2.5 MG tablet Take 2.5 mg by mouth daily.   Yes Historical Provider, MD  loratadine (CLARITIN) 10 MG tablet Take 1 tablet (10 mg total) by mouth daily. 11/29/13  Yes Shon Batonourtney F Horton, MD  venlafaxine (EFFEXOR) 100 MG tablet Take 250 mg by mouth once.   Yes Historical Provider, MD  ibuprofen (ADVIL,MOTRIN) 800 MG tablet Take 1 tablet (800 mg total) by mouth 3 (three) times daily. 03/02/16   Courteney Lyn Mackuen, MD  naproxen (NAPROSYN) 500 MG tablet Take 1 tablet (500 mg total) by mouth 2 (two) times daily. 11/26/15   Vanetta MuldersScott Zackowski, MD    Family History No family history on file.  Social History Social History  Substance Use Topics  . Smoking status: Never Smoker  . Smokeless tobacco: Never Used  . Alcohol use Yes     Comment: occasional      Allergies   Trazodone and nefazodone   Review of Systems Review of Systems  Constitutional: Negative for chills and fever.  Respiratory: Negative for shortness of breath.   Cardiovascular: Negative for chest pain.  Musculoskeletal: Positive for back pain. Negative for neck pain.  Skin: Negative for rash and wound.  Allergic/Immunologic: Negative for immunocompromised state.  Neurological: Negative for syncope, weakness, light-headedness and numbness.  Hematological:  Does not bruise/bleed easily.     Physical Exam Updated Vital Signs There were no vitals taken for this visit.  Physical Exam  Constitutional: He is oriented to person, place, and time. He appears well-developed and well-nourished. No distress.  HENT:  Head: Normocephalic and atraumatic.  Eyes: Conjunctivae are normal.  Neck: Neck supple.  Cardiovascular: Normal rate, regular rhythm and normal heart sounds.   Pulmonary/Chest: Effort normal. No respiratory distress. He has no wheezes.  Abdominal: He exhibits no distension.    Musculoskeletal: He exhibits no edema.  Neurological: He is alert and oriented to person, place, and time. He exhibits normal muscle tone.  Skin: Skin is warm. Capillary refill takes less than 2 seconds. No rash noted.  Nursing note and vitals reviewed.   Spine Exam: Inspection/Palpation: Moderate TTP diffusely over lower lumbar spine, midline and paraspinal, with no deformity. Strength: 5/5 throughout LE bilaterally (hip flexion/extension, adduction/abduction; knee flexion/extension; foot dorsiflexion/plantarflexion, inversion/eversion; great toe inversion) Sensation: Intact to light touch in proximal and distal LE bilaterally Reflexes: 2+ quadriceps and achilles reflexes  ED Treatments / Results  Labs (all labs ordered are listed, but only abnormal results are displayed) Labs Reviewed - No data to display  EKG  EKG Interpretation None       Radiology No results found.  Procedures Procedures (including critical care time)  Medications Ordered in ED Medications  HYDROmorphone (DILAUDID) injection 2 mg (2 mg Intramuscular Given 04/18/16 1417)  ketorolac (TORADOL) injection 60 mg (60 mg Intramuscular Given 04/18/16 1418)  cyclobenzaprine (FLEXERIL) tablet 10 mg (10 mg Oral Given 04/18/16 1415)     Initial Impression / Assessment and Plan / ED Course  I have reviewed the triage vital signs and the nursing notes.  Pertinent labs & imaging results that were available during my care of the patient were reviewed by me and considered in my medical decision making (see chart for details).  Clinical Course     51 year old male with past history of chronic back pain here with acute on chronic back pain. No trauma. On arrival, vital signs are stable. There are no red flag symptoms. Exam is overall reassuring. He has no loss of bowel or bladder function, saddle anesthesia, weakness, numbness, or evidence to suggest acute radiculopathy, cord compression, or cauda equina. He has no fever,  chills, or evidence of epidural abscess or osteomyelitis. Patient given IM analgesia here and [facility].  Final Clinical Impressions(s) / ED Diagnoses   Final diagnoses:  Chronic bilateral low back pain without sciatica    New Prescriptions Discharge Medication List as of 04/18/2016  2:01 PM       Shaune Pollack, MD 04/18/16 1555

## 2016-04-18 NOTE — ED Triage Notes (Signed)
Patient states he has chronic lower back pain.  Was seen here three days ago and did not receive enough dilaudid for his pain.  His pain management doctor is the [facility].

## 2016-09-26 ENCOUNTER — Emergency Department (HOSPITAL_BASED_OUTPATIENT_CLINIC_OR_DEPARTMENT_OTHER)
Admission: EM | Admit: 2016-09-26 | Discharge: 2016-09-26 | Disposition: A | Discharge status: Home / Self Care | Payer: Medicare Other | Attending: Emergency Medicine | Admitting: Emergency Medicine

## 2016-09-26 ENCOUNTER — Encounter (HOSPITAL_BASED_OUTPATIENT_CLINIC_OR_DEPARTMENT_OTHER): Payer: Self-pay

## 2016-09-26 DIAGNOSIS — Z79899 Other long term (current) drug therapy: Secondary | ICD-10-CM | POA: Diagnosis not present

## 2016-09-26 DIAGNOSIS — I10 Essential (primary) hypertension: Secondary | ICD-10-CM | POA: Diagnosis not present

## 2016-09-26 DIAGNOSIS — M5442 Lumbago with sciatica, left side: Secondary | ICD-10-CM | POA: Insufficient documentation

## 2016-09-26 DIAGNOSIS — M545 Low back pain: Secondary | ICD-10-CM | POA: Diagnosis present

## 2016-09-26 MED ORDER — HYDROMORPHONE HCL 1 MG/ML IJ SOLN
1.0000 mg | Freq: Once | INTRAMUSCULAR | Status: AC
  Administered 2016-09-26: 1 mg via INTRAMUSCULAR
  Filled 2016-09-26: qty 1

## 2016-09-26 MED ORDER — ONDANSETRON 8 MG PO TBDP
8.0000 mg | ORAL_TABLET | Freq: Once | ORAL | Status: DC
  Filled 2016-09-26: qty 1

## 2016-09-26 NOTE — ED Provider Notes (Signed)
[facility]   CSN: [id number] Arrival date & time: 09/26/16  2204   By signing my name below, I, Alex [name], attest that this documentation has been prepared under the direction and in the presence of [name], Canary Brim, *. Electronically Signed: Soijett [name], ED Scribe. 09/26/16. 11:20 PM.  History   Chief Complaint Chief Complaint  Patient presents with  . Back Pain    HPI Alex [name] is a 51 y.o. male with a PMHx of chronic back pain, HTN, who presents to the Emergency Department complaining of left lower back pain onset 2 days ago. Pt reports associated left leg pain. Pt has not tried any medications for the relief of his symptoms. Pt reports that his left lower back pain radiates to his left leg and is worsened with movement. He notes that he has chronic lower back pain issues and his symptoms are typically treated while in the ED with IM dilaudid and Rx motrin. Denies difficulty urinating, dysuria, and any other symptoms.      The history is provided by the patient. No language interpreter was used.    Past Medical History:  Diagnosis Date  . Chronic back pain   . Hypertension   . Migraine   . PTSD (post-traumatic stress disorder)     Patient Active Problem List   Diagnosis Date Noted  . HEMORRHOIDS-INTERNAL 08/18/2009  . BLOOD IN STOOL-MELENA 08/18/2009  . PERSONAL HX COLONIC POLYPS 08/18/2009  . RECTAL BLEEDING 05/28/2009  . OSTEOARTHRITIS 05/28/2009  . DEPRESSION 05/22/2009  . HYPERTENSION 05/22/2009  . LOW BACK PAIN 05/22/2009  . HEADACHE 05/22/2009  . RECTAL BLEEDING, HX OF 05/22/2009    Past Surgical History:  Procedure Laterality Date  . BACK SURGERY         Home Medications    Prior to Admission medications   Medication Sig Start Date End Date Taking? Authorizing Provider  hydrochlorothiazide (HYDRODIURIL) 12.5 MG tablet Take 12.5 mg by mouth daily.    [provider]  ibuprofen (ADVIL,MOTRIN) 800 MG  tablet Take 1 tablet (800 mg total) by mouth 3 (three) times daily. 05/15/14   Hess, Nada Boozer, PA-C  ibuprofen (ADVIL,MOTRIN) 800 MG tablet Take 1 tablet (800 mg total) by mouth 3 (three) times daily. 03/02/16   Mackuen, Courteney Lyn, MD  lisinopril (PRINIVIL,ZESTRIL) 2.5 MG tablet Take 2.5 mg by mouth daily.    [provider]  venlafaxine (EFFEXOR) 100 MG tablet Take 250 mg by mouth once.    [provider]    Family History No family history on file.  Social History Social History  Substance Use Topics  . Smoking status: Never Smoker  . Smokeless tobacco: Never Used  . Alcohol use Yes     Comment: occasional      Allergies   Trazodone and nefazodone   Review of Systems Review of Systems  Genitourinary: Negative for difficulty urinating and dysuria.  Musculoskeletal: Positive for arthralgias (left leg) and back pain (left lower).  All other systems reviewed and are negative.    Physical Exam Updated Vital Signs BP 111/76 (BP Location: Right Arm)   Pulse 62   Temp 98.9 F (37.2 C) (Oral)   Resp 20   Ht 5\' 7"  (1.702 m)   Wt 188 lb (85.3 kg)   SpO2 97%   BMI 29.44 kg/m   Physical Exam  Constitutional: He is oriented to person, place, and time. He appears well-developed and well-nourished. No distress.  HENT:  Head: Normocephalic  and atraumatic.  Right Ear: Hearing normal.  Left Ear: Hearing normal.  Nose: Nose normal.  Mouth/Throat: Oropharynx is clear and moist and mucous membranes are normal.  Eyes: Conjunctivae and EOM are normal. Pupils are equal, round, and reactive to light.  Neck: Normal range of motion. Neck supple.  Cardiovascular: Regular rhythm, S1 normal and S2 normal.  Exam reveals no gallop and no friction rub.   No murmur heard. Pulmonary/Chest: Effort normal and breath sounds normal. No respiratory distress. He exhibits no tenderness.  Abdominal: Soft. Normal appearance and bowel sounds are normal. There is no  hepatosplenomegaly. There is no tenderness. There is no rebound, no guarding, no tenderness at McBurney's point and negative Murphy's sign. No hernia.  Musculoskeletal: Normal range of motion.  Slightly limited flexion of left hip secondary to pain. Nl strength and sensation.   Neurological: He is alert and oriented to person, place, and time. He has normal strength. No cranial nerve deficit or sensory deficit. Coordination normal. GCS eye subscore is 4. GCS verbal subscore is 5. GCS motor subscore is 6.  Skin: Skin is warm, dry and intact. No rash noted. No cyanosis.  Psychiatric: He has a normal mood and affect. His speech is normal and behavior is normal. Thought content normal.  Nursing note and vitals reviewed.    ED Treatments / Results  DIAGNOSTIC STUDIES: Oxygen Saturation is 97% on RA, nl by my interpretation.    COORDINATION OF CARE: 11:18 PM Discussed treatment plan with pt at bedside and pt agreed to plan.   Labs (all labs ordered are listed, but only abnormal results are displayed) Labs Reviewed - No data to display  EKG  EKG Interpretation None       Radiology No results found.  Procedures Procedures (including critical care time)  Medications Ordered in ED Medications  ondansetron (ZOFRAN-ODT) disintegrating tablet 8 mg (8 mg Oral Refused 09/26/16 2329)  HYDROmorphone (DILAUDID) injection 1 mg (1 mg Intramuscular Given 09/26/16 2331)     Initial Impression / Assessment and Plan / ED Course  I have reviewed the triage vital signs and the nursing notes.  Pertinent labs & imaging results that were available during my care of the patient were reviewed by me and considered in my medical decision making (see chart for details).     Patient presents to the ER with musculoskeletal back pain. Examination reveals back tenderness without any associated neurologic findings. Patient's strength, sensation and reflexes were normal. There is no evidence of saddle  anesthesia. Patient does not have a foot drop. Patient has not experienced any change in bowel or bladder function. As such, patient did not require any imaging or further studies. Patient was treated with analgesia.  Final Clinical Impressions(s) / ED Diagnoses   Final diagnoses:  Acute left-sided low back pain with left-sided sciatica    New Prescriptions Discharge Medication List as of 09/26/2016 11:23 PM     I personally performed the services described in this documentation, which was scribed in my presence. The recorded information has been reviewed and is accurate.      Gilda CreasePollina, Kenyana Husak J, MD 09/27/16 0002

## 2016-09-26 NOTE — ED Triage Notes (Signed)
C/o "chronic" lower back pain x 2 days-presents to triage in w/c

## 2016-09-26 NOTE — ED Notes (Addendum)
Alert, NAD, calm, interactive, resps e/u, speaking in clear complete sentences, no dyspnea noted, skin W&D. [facility]  [facility], states, "Dilaudid 1mg  isn't going to help, I try to avoid steroids, has had Lyrica and Neurontin in the past w/o benefit, usually use ibuprofen daily for maintenance".

## 2016-09-27 ENCOUNTER — Emergency Department (HOSPITAL_BASED_OUTPATIENT_CLINIC_OR_DEPARTMENT_OTHER)
Admission: EM | Admit: 2016-09-27 | Discharge: 2016-09-27 | Disposition: A | Discharge status: Home / Self Care | Payer: Medicare Other | Attending: Emergency Medicine | Admitting: Emergency Medicine

## 2016-09-27 ENCOUNTER — Encounter (HOSPITAL_BASED_OUTPATIENT_CLINIC_OR_DEPARTMENT_OTHER): Payer: Self-pay

## 2016-09-27 DIAGNOSIS — M5442 Lumbago with sciatica, left side: Secondary | ICD-10-CM | POA: Insufficient documentation

## 2016-09-27 DIAGNOSIS — Z79899 Other long term (current) drug therapy: Secondary | ICD-10-CM | POA: Diagnosis not present

## 2016-09-27 DIAGNOSIS — M5441 Lumbago with sciatica, right side: Secondary | ICD-10-CM | POA: Insufficient documentation

## 2016-09-27 DIAGNOSIS — M545 Low back pain: Secondary | ICD-10-CM | POA: Diagnosis present

## 2016-09-27 DIAGNOSIS — G8929 Other chronic pain: Secondary | ICD-10-CM

## 2016-09-27 DIAGNOSIS — I10 Essential (primary) hypertension: Secondary | ICD-10-CM | POA: Insufficient documentation

## 2016-09-27 MED ORDER — KETOROLAC TROMETHAMINE 60 MG/2ML IM SOLN
60.0000 mg | Freq: Once | INTRAMUSCULAR | Status: AC
  Administered 2016-09-27: 60 mg via INTRAMUSCULAR
  Filled 2016-09-27: qty 2

## 2016-09-27 MED ORDER — HYDROMORPHONE HCL 1 MG/ML IJ SOLN
2.0000 mg | Freq: Once | INTRAMUSCULAR | Status: AC
  Administered 2016-09-27: 2 mg via INTRAMUSCULAR
  Filled 2016-09-27: qty 2

## 2016-09-27 MED ORDER — HYDROMORPHONE HCL 1 MG/ML IJ SOLN: 2.0000 mg | Freq: Once | INTRAMUSCULAR | Status: DC

## 2016-09-27 NOTE — ED Provider Notes (Signed)
[facility] Provider Note   CSN: [id number] Arrival date & time: 09/27/16  1748  By signing my name below, I, Freida Busman, attest that this documentation has been prepared under the direction and in the presence of Shaune Pollack, MD . Electronically Signed: Freida Busman, Scribe. 09/27/2016. 6:26 PM.  History   Chief Complaint Chief Complaint  Patient presents with  . Back Pain    The history is provided by the patient. No language interpreter was used.    HPI Comments:  Alex [name] is a 51 y.o. male with a history of chronic back pain and laminectomy, who presents to the Emergency Department complaining of constant, lower back pain x ~ 3 days. This is his typical chronic pain. He is unsure what caused this flare-up of pain. He reports intermittent shooting pain into his BLE. Pt was seen in the ED on 09/26/2016 for the same pain. He received dilaudid and zofran at that time and was discharged home. He states he is on motrin and ice and doesn't have any narcotics at home. His pain is managed at the [place].  No bowel/bladder incontinence, fever, chills, and numbness/weakness. He is able to ambulate without difficulty but reports increased pain when doing so.   Past Medical History:  Diagnosis Date  . Chronic back pain   . Hypertension   . Migraine   . PTSD (post-traumatic stress disorder)     Patient Active Problem List   Diagnosis Date Noted  . HEMORRHOIDS-INTERNAL 08/18/2009  . BLOOD IN STOOL-MELENA 08/18/2009  . PERSONAL HX COLONIC POLYPS 08/18/2009  . RECTAL BLEEDING 05/28/2009  . OSTEOARTHRITIS 05/28/2009  . DEPRESSION 05/22/2009  . HYPERTENSION 05/22/2009  . LOW BACK PAIN 05/22/2009  . HEADACHE 05/22/2009  . RECTAL BLEEDING, HX OF 05/22/2009    Past Surgical History:  Procedure Laterality Date  . BACK SURGERY         Home Medications    Prior to Admission medications   Medication Sig Start Date End Date Taking? Authorizing Provider    hydrochlorothiazide (HYDRODIURIL) 12.5 MG tablet Take 12.5 mg by mouth daily.    [provider]  ibuprofen (ADVIL,MOTRIN) 800 MG tablet Take 1 tablet (800 mg total) by mouth 3 (three) times daily. 05/15/14   Hess, Nada Boozer, PA-C  ibuprofen (ADVIL,MOTRIN) 800 MG tablet Take 1 tablet (800 mg total) by mouth 3 (three) times daily. 03/02/16   Mackuen, Courteney Lyn, MD  lisinopril (PRINIVIL,ZESTRIL) 2.5 MG tablet Take 2.5 mg by mouth daily.    [provider]  venlafaxine (EFFEXOR) 100 MG tablet Take 250 mg by mouth once.    [provider]    Family History No family history on file.  Social History Social History  Substance Use Topics  . Smoking status: Never Smoker  . Smokeless tobacco: Never Used  . Alcohol use Yes     Comment: occ     Allergies   Trazodone and nefazodone   Review of Systems Review of Systems  Musculoskeletal: Positive for back pain.  All other systems reviewed and are negative.    Physical Exam Updated Vital Signs BP 126/83 (BP Location: Right Arm)   Pulse 60   Temp 98.5 F (36.9 C) (Oral)   Resp 16   Ht 5\' 7"  (1.702 m)   Wt 85.3 kg (188 lb)   SpO2 100%   BMI 29.44 kg/m   Physical Exam  Constitutional: He is oriented to person, place, and time. He appears well-developed and well-nourished. No distress.  HENT:  Head: Normocephalic and atraumatic.  Eyes: Conjunctivae are normal.  Neck: Neck supple.  Cardiovascular: Normal rate, regular rhythm and normal heart sounds.  Exam reveals no friction rub.   No murmur heard. Pulmonary/Chest: Effort normal and breath sounds normal. No respiratory distress. He has no wheezes. He has no rales.  Abdominal: He exhibits no distension.  Musculoskeletal: He exhibits no edema.  Neurological: He is alert and oriented to person, place, and time. He exhibits normal muscle tone.  Skin: Skin is warm. Capillary refill takes less than 2 seconds.  Psychiatric: He has a normal mood and affect.   Nursing note and vitals reviewed.   Spine Exam: Inspection/Palpation: Moderate TTP over bilateral lower paraspinal and midline lower back/lumbar spine. No deformity. Strength: 5/5 throughout LE bilaterally (hip flexion/extension, adduction/abduction; knee flexion/extension; foot dorsiflexion/plantarflexion, inversion/eversion; great toe inversion) Sensation: Intact to light touch in proximal and distal LE bilaterally Reflexes: 2+ quadriceps and achilles reflexes   ED Treatments / Results  DIAGNOSTIC STUDIES:  Oxygen Saturation is 98% on RA, normal by my interpretation.    COORDINATION OF CARE:  6:25 PM Discussed treatment plan with pt at bedside and pt agreed to plan.  Labs (all labs ordered are listed, but only abnormal results are displayed) Labs Reviewed - No data to display  EKG  EKG Interpretation None       Radiology No results found.  Procedures Procedures (including critical care time)  Medications Ordered in ED Medications  ketorolac (TORADOL) injection 60 mg (60 mg Intramuscular Given 09/27/16 1908)  HYDROmorphone (DILAUDID) injection 2 mg (2 mg Intramuscular Given 09/27/16 1906)     Initial Impression / Assessment and Plan / ED Course  I have reviewed the triage vital signs and the nursing notes.  Pertinent labs & imaging results that were available during my care of the patient were reviewed by me and considered in my medical decision making (see chart for details).     51 yo M with h/o chronic LBP here with worsening back pain. Seen yesterday, states pain has returned. No new or concerning sx. No red flags. No fever, chills, or s/s epidural abscess or osteo. Pt is neurovascularly intact b/l LE with no sx to suggest cauda equina, AAA or dissection. He has no evidence of acute cord compression. Pt given analgesia here and told to f/u with his spine surgeon.  Final Clinical Impressions(s) / ED Diagnoses   Final diagnoses:  Chronic midline low back pain  with bilateral sciatica    New Prescriptions Discharge Medication List as of 09/27/2016  6:32 PM      I personally performed the services described in this documentation, which was scribed in my presence. The recorded information has been reviewed and is accurate.    Shaune PollackIsaacs, Shauntay Brunelli, MD 09/27/16 [phone]

## 2016-09-27 NOTE — ED Triage Notes (Signed)
C/o lower back pain x 3 days-hx chronic back pain-presents to triage in w/c

## 2016-09-27 NOTE — ED Notes (Signed)
ED Provider at bedside. 

## 2016-10-11 ENCOUNTER — Encounter (HOSPITAL_BASED_OUTPATIENT_CLINIC_OR_DEPARTMENT_OTHER): Payer: Self-pay | Admitting: *Deleted

## 2016-10-11 ENCOUNTER — Emergency Department (HOSPITAL_BASED_OUTPATIENT_CLINIC_OR_DEPARTMENT_OTHER)
Admission: EM | Admit: 2016-10-11 | Discharge: 2016-10-11 | Disposition: A | Discharge status: Home / Self Care | Payer: Medicare Other | Attending: Emergency Medicine | Admitting: Emergency Medicine

## 2016-10-11 DIAGNOSIS — M5431 Sciatica, right side: Secondary | ICD-10-CM | POA: Insufficient documentation

## 2016-10-11 DIAGNOSIS — Z79899 Other long term (current) drug therapy: Secondary | ICD-10-CM | POA: Diagnosis not present

## 2016-10-11 DIAGNOSIS — M545 Low back pain: Secondary | ICD-10-CM | POA: Diagnosis present

## 2016-10-11 DIAGNOSIS — M5441 Lumbago with sciatica, right side: Secondary | ICD-10-CM

## 2016-10-11 DIAGNOSIS — I10 Essential (primary) hypertension: Secondary | ICD-10-CM | POA: Insufficient documentation

## 2016-10-11 DIAGNOSIS — G8929 Other chronic pain: Secondary | ICD-10-CM

## 2016-10-11 MED ORDER — KETOROLAC TROMETHAMINE 60 MG/2ML IM SOLN
60.0000 mg | Freq: Once | INTRAMUSCULAR | Status: AC
  Administered 2016-10-11: 60 mg via INTRAMUSCULAR
  Filled 2016-10-11: qty 2

## 2016-10-11 MED ORDER — HYDROMORPHONE HCL 1 MG/ML IJ SOLN
1.0000 mg | Freq: Once | INTRAMUSCULAR | Status: AC
  Administered 2016-10-11: 1 mg via INTRAMUSCULAR
  Filled 2016-10-11: qty 1

## 2016-10-11 NOTE — Discharge Instructions (Signed)
Given your long history of chronic back pain and severe pain today your pain was treated in the ED. You have no recent injuries, falls or trauma so imaging was not thought to be necessary today.   As we discussed, the best way to manage chronic pain is through a pain management clinic. I encourage you to reach out to local pain clinics so you obtain better control of your chronic pain. Return to the ED  if you develop back pain that is associated with numbness, weakness, groin numbness, bowel or bladder incontinence or retention, fevers

## 2016-10-11 NOTE — ED Notes (Signed)
Pt given a snack and gingerale per RN.

## 2016-10-11 NOTE — ED Triage Notes (Signed)
Pt c/o chronic lower back pain. 

## 2016-10-11 NOTE — ED Provider Notes (Signed)
[facility] Provider Note   CSN: [id number] Arrival date & time: 10/11/16  1921  By signing my name below, I, Linna Darner, attest that this documentation has been prepared under the direction and in the presence of Sharen Heck, PA-C. Electronically Signed: Linna Darner, Scribe. 10/11/2016. 9:36 PM.  History   Chief Complaint Chief Complaint  Patient presents with  . Back Pain   The history is provided by the patient. No language interpreter was used.    HPI Comments: Travaris Kosh is a 51 y.o. male with PMHx including chronic back pain who presents to the Emergency Department complaining of acute on chronic, severe lower back pain for 3 days. His pain is most significant in the right buttock and is worse with movement. He has been using ibuprofen, acetaminophen, and ice over the last several days without improvement.  Usually these interventions control his chronic back pain. No recent or preceding exertional activity, falls or trauma to his back.   Pt has h/o fractures to L1, L2, and L3 in 1985 and has had chronic pain since. He has a PSHx of discectomy and states he has been advised to have fusion surgery but is fearful of the associated risks. He has flare ups of back pain about once a month and states this is his second flare up this month. Patient reports that he comes to the ED when he has exacerbations of his chronic back pain and receives Dilaudid 2mg  and Toradol 60mg  with relief. [facility] Percocet for his back pain, think he became "hooked" and has not used daily prescription narcotics in over one year. Patient comes to ED when he cannot manage his chronic back pain at home with OTC remedies.   Patient denies fevers, chills, abdominal pain, difficulty urinating, constipation, numbness/tingling, or any other associated symptoms.   Past Medical History:  Diagnosis Date  . Chronic back pain   . Hypertension   . Migraine    . PTSD (post-traumatic stress disorder)     Patient Active Problem List   Diagnosis Date Noted  . HEMORRHOIDS-INTERNAL 08/18/2009  . BLOOD IN STOOL-MELENA 08/18/2009  . PERSONAL HX COLONIC POLYPS 08/18/2009  . RECTAL BLEEDING 05/28/2009  . OSTEOARTHRITIS 05/28/2009  . DEPRESSION 05/22/2009  . HYPERTENSION 05/22/2009  . LOW BACK PAIN 05/22/2009  . HEADACHE 05/22/2009  . RECTAL BLEEDING, HX OF 05/22/2009    Past Surgical History:  Procedure Laterality Date  . BACK SURGERY         Home Medications    Prior to Admission medications   Medication Sig Start Date End Date Taking? Authorizing Provider  hydrochlorothiazide (HYDRODIURIL) 12.5 MG tablet Take 12.5 mg by mouth daily.    [provider]  ibuprofen (ADVIL,MOTRIN) 800 MG tablet Take 1 tablet (800 mg total) by mouth 3 (three) times daily. 05/15/14   Hess, Nada Boozer, PA-C  ibuprofen (ADVIL,MOTRIN) 800 MG tablet Take 1 tablet (800 mg total) by mouth 3 (three) times daily. 03/02/16   Mackuen, Courteney Lyn, MD  lisinopril (PRINIVIL,ZESTRIL) 2.5 MG tablet Take 2.5 mg by mouth daily.    [provider]  venlafaxine (EFFEXOR) 100 MG tablet Take 250 mg by mouth once.    [provider]    Family History History reviewed. No pertinent family history.  Social History Social History  Substance Use Topics  . Smoking status: Never Smoker  . Smokeless tobacco: Never Used  . Alcohol use Yes  Comment: occ     Allergies   Trazodone and nefazodone   Review of Systems Review of Systems  Constitutional: Negative for chills and fever.  Gastrointestinal: Negative for abdominal pain and constipation.  Genitourinary: Negative for difficulty urinating.  Musculoskeletal: Positive for back pain and myalgias.  Neurological: Negative for numbness.   Physical Exam Updated Vital Signs BP (!) 165/106 (BP Location: Right Arm)   Pulse (!) 52   Temp 98.6 F (37 C)   Resp 18   Ht 5\' 7"  (1.702 m)   Wt 85.3  kg (188 lb)   SpO2 100%   BMI 29.44 kg/m   Physical Exam  Constitutional: He is oriented to person, place, and time. He appears well-developed and well-nourished. No distress.  NAD.  HENT:  Head: Normocephalic and atraumatic.  Right Ear: External ear normal.  Left Ear: External ear normal.  Nose: Nose normal.  Eyes: Conjunctivae and EOM are normal. No scleral icterus.  Neck: Normal range of motion. Neck supple.  Cardiovascular: Normal rate, regular rhythm, normal heart sounds and intact distal pulses.   No murmur heard. Pulmonary/Chest: Effort normal and breath sounds normal. He has no wheezes.  Musculoskeletal: He exhibits tenderness.  Vertical incision to the lumbar spine. Point tenderness to lumbar spine, right SI joint, and right sciatic notch. Positive straight leg raise on the right. Antalgic gait favoring the right.  Neurological: He is alert and oriented to person, place, and time.  5/5 strength with hip flexion and extension, bilaterally.  5/5 strength with knee flexion and extension, bilaterally.  5/5 strength with ankle dorsiflexion and plantar flexion, bilaterally.  Sensation to light touch intact in the distribution of the obturator nerve and lateral cutaneous nerve  Feet: sensation to light touch intact in the distribution of the saphenous nerve and sural nerve, bilaterally.   Skin: Skin is warm and dry. Capillary refill takes less than 2 seconds.  Psychiatric: He has a normal mood and affect. His behavior is normal. Judgment and thought content normal.  Nursing note and vitals reviewed.  ED Treatments / Results  Labs (all labs ordered are listed, but only abnormal results are displayed) Labs Reviewed - No data to display  EKG  EKG Interpretation None       Radiology No results found.  Procedures Procedures (including critical care time)  DIAGNOSTIC STUDIES: Oxygen Saturation is 100% on RA, normal by my interpretation.    COORDINATION OF CARE: 9:19 PM  Discussed treatment plan with pt at bedside and pt agreed to plan.  Medications Ordered in ED Medications  HYDROmorphone (DILAUDID) injection 1 mg (1 mg Intramuscular Given 10/11/16 2147)  ketorolac (TORADOL) injection 60 mg (60 mg Intramuscular Given 10/11/16 2146)     Initial Impression / Assessment and Plan / ED Course  I have reviewed the triage vital signs and the nursing notes.  Pertinent labs & imaging results that were available during my care of the patient were reviewed by me and considered in my medical decision making (see chart for details).      51 year old male with history of chronic low back pain since 1980s presents to the ED with acute on chronic back pain that he cannot control with his typical over-the-counter remedies. No preceding exertional activity, injury or fall. He reports his flare today is very typical of his previous flares that bring him to the ED for acute pain management. He has a long history of chronic back pain. His care is managed through the [facility]. He notes  he thought he was "hooked" on Percocet and has since stopped taking narcotic pain medications on a daily basis to avoid getting addicted. Typically he manages his pain with ibuprofen, Tylenol, ice however over the last 3 days these interventions have provided no relief of his pain. No red flag symptoms associated with back pain including IV drug use, fever, recent falls, fell on his [place], bladder/bowel incontinence or retention, focal numbness or weakness. Exam today appears to be typical for previous ED visits.  Had long discussion with patient regarding chronic pain, I recommended he establish care with pain clinic however he notes "the [place] is useless for that stuff". I think treating his acute pain in the ED is reasonable, he is not requesting prescription for narcotics today, he is trying to avoid daily narcotic medicines. ED return precautions given. [place] narcotic database reviewed, he does not have any  document rx for narcotic meds.   Final Clinical Impressions(s) / ED Diagnoses   Final diagnoses:  Chronic bilateral low back pain with right-sided sciatica    New Prescriptions New Prescriptions   No medications on file   I personally performed the services described in this documentation, which was scribed in my presence. The recorded information has been reviewed and is accurate.    Jerrell Mylar 10/11/16 2157    Tilden Fossa, MD 10/12/16 1455

## 2016-12-13 ENCOUNTER — Emergency Department (HOSPITAL_BASED_OUTPATIENT_CLINIC_OR_DEPARTMENT_OTHER)
Admission: EM | Admit: 2016-12-13 | Discharge: 2016-12-13 | Disposition: A | Discharge status: Home / Self Care | Payer: Medicare Other | Attending: Emergency Medicine | Admitting: Emergency Medicine

## 2016-12-13 ENCOUNTER — Encounter (HOSPITAL_BASED_OUTPATIENT_CLINIC_OR_DEPARTMENT_OTHER): Payer: Self-pay

## 2016-12-13 DIAGNOSIS — M5412 Radiculopathy, cervical region: Secondary | ICD-10-CM | POA: Diagnosis not present

## 2016-12-13 DIAGNOSIS — Z79899 Other long term (current) drug therapy: Secondary | ICD-10-CM | POA: Diagnosis not present

## 2016-12-13 DIAGNOSIS — M25511 Pain in right shoulder: Secondary | ICD-10-CM | POA: Diagnosis present

## 2016-12-13 DIAGNOSIS — I10 Essential (primary) hypertension: Secondary | ICD-10-CM | POA: Insufficient documentation

## 2016-12-13 MED ORDER — HYDROCODONE-ACETAMINOPHEN 5-325 MG PO TABS: 1.0000 | ORAL_TABLET | ORAL | 0 refills | Status: DC | PRN

## 2016-12-13 MED ORDER — HYDROCODONE-ACETAMINOPHEN 5-325 MG PO TABS
2.0000 | ORAL_TABLET | Freq: Once | ORAL | Status: AC
  Administered 2016-12-13: 2 via ORAL
  Filled 2016-12-13: qty 2

## 2016-12-13 NOTE — ED Provider Notes (Addendum)
[facility] Provider Note: [name], MD, FACEP  CSN: [id number] MRN: [id number] ARRIVAL: 12/13/16 at 0345 ROOM: [facility]09/[facility]09   CHIEF COMPLAINT  Shoulder Pain   HISTORY OF PRESENT ILLNESS  12/13/16 4:22 AM Alex [name] is a 51 y.o. male with a two-day history of pain in the right side of his neck radiating to his right shoulder and down to his right elbow. There is no involvement of his right forearm or fingers. There is no associated numbness or weakness. Pain is worse with movement of his neck. He describes the pain as dull and an 8 out of 10 at its worse. He denies injury to his neck or shoulder. He has been taking ibuprofen without relief.  Consultation with the [facility] state controlled substances database reveals the patient has received no opioid pain medication in the past year.   Past Medical History:  Diagnosis Date  . Chronic back pain   . Hypertension   . Migraine   . PTSD (post-traumatic stress disorder)     Past Surgical History:  Procedure Laterality Date  . BACK SURGERY      No family history on file.  Social History  Substance Use Topics  . Smoking status: Never Smoker  . Smokeless tobacco: Never Used  . Alcohol use Yes     Comment: occ    Prior to Admission medications   Medication Sig Start Date End Date Taking? Authorizing Provider  hydrochlorothiazide (HYDRODIURIL) 12.5 MG tablet Take 12.5 mg by mouth daily.    [provider]  ibuprofen (ADVIL,MOTRIN) 800 MG tablet Take 1 tablet (800 mg total) by mouth 3 (three) times daily. 05/15/14   Hess, Nada Boozer, PA-C  ibuprofen (ADVIL,MOTRIN) 800 MG tablet Take 1 tablet (800 mg total) by mouth 3 (three) times daily. 03/02/16   Mackuen, Courteney Lyn, MD  lisinopril (PRINIVIL,ZESTRIL) 2.5 MG tablet Take 2.5 mg by mouth daily.    [provider]  venlafaxine (EFFEXOR) 100 MG tablet Take 250 mg by mouth once.    [provider]    Allergies Trazodone and  nefazodone   REVIEW OF SYSTEMS  Negative except as noted here or in the History of Present Illness.   PHYSICAL EXAMINATION  Initial Vital Signs Blood pressure (!) 134/106, pulse 61, temperature 98.2 F (36.8 C), temperature source Oral, resp. rate 18, height 5\' 7"  (1.702 m), weight 83.9 kg (185 lb), SpO2 98 %.  Examination General: Well-developed, well-nourished male in no acute distress; appearance consistent with age of record HENT: normocephalic; atraumatic Eyes: pupils equal, round and reactive to light; extraocular muscles intact Neck: supple; rotation of the neck to the left or right as well as flexion of the neck anteriorly or posteriorly exacerbates pain Heart: regular rate and rhythm Lungs: clear to auscultation bilaterally Abdomen: soft; nondistended; nontender; bowel sounds present Extremities: No deformity; full range of motion; pulses normal Neurologic: Awake, alert and oriented; motor function intact in all extremities and symmetric; sensation intact in upper extremities and symmetric; no facial droop Skin: Warm and dry Psychiatric: Normal mood and affect   RESULTS  Summary of this visit's results, reviewed by myself:   EKG Interpretation  Date/Time:    Ventricular Rate:    PR Interval:    QRS Duration:   QT Interval:    QTC Calculation:   R Axis:     Text Interpretation:        Laboratory Studies: No results found for this or any previous visit (from the  past 24 hour(s)). Imaging Studies: No results found.  ED COURSE  Nursing notes and initial vitals signs, including pulse oximetry, reviewed.  Vitals:   12/13/16 0350  BP: (!) 134/106  Pulse: 61  Resp: 18  Temp: 98.2 F (36.8 C)  TempSrc: Oral  SpO2: 98%  Weight: 83.9 kg (185 lb)  Height: 5\' 7"  (1.702 m)   Patient symptoms consistent with cervical radiculopathy but the exact dermatome is difficult to determine due to lack of localization particularly in the forearm and fingers.  PROCEDURES     ED DIAGNOSES     ICD-10-CM   1. Cervical radiculopathy M54.12        Alex [name], Jonny Ruiz, MD 12/13/16 0429    Alex Libra, MD 12/13/16 [phone]

## 2016-12-13 NOTE — ED Notes (Signed)
Pt verbalizes understanding of d/c instructions and denies any further needs at this time. 

## 2016-12-13 NOTE — ED Triage Notes (Signed)
Pt c/o right shoulder and right neck pain that has gone unrelieved by heat, ice and ibuprofen, no known injury

## 2017-01-14 ENCOUNTER — Encounter (HOSPITAL_BASED_OUTPATIENT_CLINIC_OR_DEPARTMENT_OTHER): Payer: Self-pay | Admitting: *Deleted

## 2017-01-14 ENCOUNTER — Emergency Department (HOSPITAL_BASED_OUTPATIENT_CLINIC_OR_DEPARTMENT_OTHER)
Admission: EM | Admit: 2017-01-14 | Discharge: 2017-01-14 | Disposition: A | Discharge status: Home / Self Care | Payer: Medicare Other | Attending: Emergency Medicine | Admitting: Emergency Medicine

## 2017-01-14 DIAGNOSIS — M5430 Sciatica, unspecified side: Secondary | ICD-10-CM | POA: Insufficient documentation

## 2017-01-14 DIAGNOSIS — M5440 Lumbago with sciatica, unspecified side: Secondary | ICD-10-CM

## 2017-01-14 DIAGNOSIS — I10 Essential (primary) hypertension: Secondary | ICD-10-CM | POA: Insufficient documentation

## 2017-01-14 DIAGNOSIS — Z79899 Other long term (current) drug therapy: Secondary | ICD-10-CM | POA: Diagnosis not present

## 2017-01-14 DIAGNOSIS — M545 Low back pain: Secondary | ICD-10-CM | POA: Insufficient documentation

## 2017-01-14 DIAGNOSIS — G8929 Other chronic pain: Secondary | ICD-10-CM

## 2017-01-14 MED ORDER — HYDROMORPHONE HCL 1 MG/ML IJ SOLN
2.0000 mg | Freq: Once | INTRAMUSCULAR | Status: AC
  Administered 2017-01-14: 2 mg via INTRAMUSCULAR
  Filled 2017-01-14: qty 2

## 2017-01-14 MED ORDER — KETOROLAC TROMETHAMINE 60 MG/2ML IM SOLN
60.0000 mg | Freq: Once | INTRAMUSCULAR | Status: AC
  Administered 2017-01-14: 60 mg via INTRAMUSCULAR
  Filled 2017-01-14: qty 2

## 2017-01-14 NOTE — ED Triage Notes (Signed)
C/o lower left back pain x 1 week, left lower back radiating across waist and down left leg

## 2017-01-14 NOTE — ED Provider Notes (Signed)
[facility] Provider Note   CSN: [id number] Arrival date & time: 01/14/17  0620     History   Chief Complaint Chief Complaint  Patient presents with  . Back Pain    HPI Alex [name] is a 51 y.o. male.  Patient is a 51 year old male who Presents with lower back pain. He has a history of chronic low back pain due to prior fractures. He's followed by the [facility] who does not prescribe him headache pain medications anymore. He states that typically he is able to manage his pain at home with his other medications but from time to time he has to come in and get a shot for pain management. He denies any new symptoms. He has constant throbbing pain across his low back with some radiation down his left leg. He denies any numbness or weakness to his extremities. No loss of bowel or bladder control. No recent injuries. No fevers.      Past Medical History:  Diagnosis Date  . Chronic back pain   . Hypertension   . Migraine   . PTSD (post-traumatic stress disorder)     Patient Active Problem List   Diagnosis Date Noted  . HEMORRHOIDS-INTERNAL 08/18/2009  . BLOOD IN STOOL-MELENA 08/18/2009  . PERSONAL HX COLONIC POLYPS 08/18/2009  . RECTAL BLEEDING 05/28/2009  . OSTEOARTHRITIS 05/28/2009  . DEPRESSION 05/22/2009  . HYPERTENSION 05/22/2009  . LOW BACK PAIN 05/22/2009  . HEADACHE 05/22/2009  . RECTAL BLEEDING, HX OF 05/22/2009    Past Surgical History:  Procedure Laterality Date  . BACK SURGERY         Home Medications    Prior to Admission medications   Medication Sig Start Date End Date Taking? Authorizing Provider  hydrochlorothiazide (HYDRODIURIL) 12.5 MG tablet Take 12.5 mg by mouth daily.    [provider]  HYDROcodone-acetaminophen (NORCO) 5-325 MG tablet Take 1 tablet by mouth every 4 (four) hours as needed (for pain). 12/13/16   Molpus, John, MD  lisinopril (PRINIVIL,ZESTRIL) 2.5 MG tablet Take 2.5 mg by mouth daily.    [provider]  venlafaxine (EFFEXOR) 100 MG tablet Take 250 mg by mouth once.    [provider]    Family History No family history on file.  Social History Social History  Substance Use Topics  . Smoking status: Never Smoker  . Smokeless tobacco: Never Used  . Alcohol use Yes     Comment: occ     Allergies   Trazodone and nefazodone   Review of Systems Review of Systems  Constitutional: Negative for fever.  Gastrointestinal: Negative for nausea and vomiting.  Musculoskeletal: Positive for back pain. Negative for arthralgias, joint swelling and neck pain.  Skin: Negative for wound.  Neurological: Negative for weakness, numbness and headaches.     Physical Exam Updated Vital Signs BP (!) 138/109 (BP Location: Right Arm)   Pulse (!) 56   Temp 98.3 F (36.8 C) (Oral)   Resp 16   Ht  (1.702 m)   Wt 84.8 kg (187 lb)   SpO2 100%   BMI 29.29 kg/m   Physical Exam  Constitutional: He is oriented to person, place, and time. He appears well-developed and well-nourished.  HENT:  Head: Normocephalic and atraumatic.  Neck: Normal range of motion. Neck supple.  Cardiovascular: Normal rate.   Pulmonary/Chest: Effort normal.  Musculoskeletal: He exhibits no edema or tenderness.  Positive tenderness across the lumbar area with pain in the left sciatic area. Negative  straight leg raise bilaterally. There is normal motor function and sensation in the lower extremities. Pedal pulses are intact  Neurological: He is alert and oriented to person, place, and time.  Skin: Skin is warm and dry.  Psychiatric: He has a normal mood and affect.     ED Treatments / Results  Labs (all labs ordered are listed, but only abnormal results are displayed) Labs Reviewed - No data to display  EKG  EKG Interpretation None       Radiology No results found.  Procedures Procedures (including critical care time)  Medications Ordered in ED Medications  HYDROmorphone  (DILAUDID) injection 2 mg (not administered)  ketorolac (TORADOL) injection 60 mg (not administered)     Initial Impression / Assessment and Plan / ED Course  I have reviewed the triage vital signs and the nursing notes.  Pertinent labs & imaging results that were available during my care of the patient were reviewed by me and considered in my medical decision making (see chart for details).     Patient received his injection of pain medications. He was discharged in good condition. He has chronic back pain which is unchanged. There is no suggestions of cauda equina syndrome. He was encouraged to follow-up with his PCP.  Final Clinical Impressions(s) / ED Diagnoses   Final diagnoses:  Chronic low back pain with sciatica, sciatica laterality unspecified, unspecified back pain laterality    New Prescriptions New Prescriptions   No medications on file     Rolan Bucco, MD 01/14/17 [phone]

## 2017-02-07 ENCOUNTER — Emergency Department (HOSPITAL_BASED_OUTPATIENT_CLINIC_OR_DEPARTMENT_OTHER)
Admission: EM | Admit: 2017-02-07 | Discharge: 2017-02-07 | Disposition: A | Discharge status: Home / Self Care | Payer: Medicare Other | Attending: Emergency Medicine | Admitting: Emergency Medicine

## 2017-02-07 ENCOUNTER — Encounter (HOSPITAL_BASED_OUTPATIENT_CLINIC_OR_DEPARTMENT_OTHER): Payer: Self-pay | Admitting: *Deleted

## 2017-02-07 DIAGNOSIS — I10 Essential (primary) hypertension: Secondary | ICD-10-CM | POA: Diagnosis not present

## 2017-02-07 DIAGNOSIS — M545 Low back pain, unspecified: Secondary | ICD-10-CM

## 2017-02-07 DIAGNOSIS — G8929 Other chronic pain: Secondary | ICD-10-CM

## 2017-02-07 DIAGNOSIS — Z79899 Other long term (current) drug therapy: Secondary | ICD-10-CM | POA: Diagnosis not present

## 2017-02-07 MED ORDER — HYDROMORPHONE HCL 1 MG/ML IJ SOLN
1.0000 mg | Freq: Once | INTRAMUSCULAR | Status: AC
  Administered 2017-02-07: 1 mg via INTRAMUSCULAR
  Filled 2017-02-07: qty 1

## 2017-02-07 MED ORDER — KETOROLAC TROMETHAMINE 30 MG/ML IJ SOLN
30.0000 mg | Freq: Once | INTRAMUSCULAR | Status: AC
  Administered 2017-02-07: 30 mg via INTRAMUSCULAR
  Filled 2017-02-07: qty 1

## 2017-02-07 NOTE — Discharge Instructions (Signed)
It was my pleasure taking care of you today!   You have been seen in the Emergency Department today for back pain.   Follow up with your primary care provider for further discussion of your back pain.   Return to the ED for worsening back pain, fever, weakness or numbness of either leg, or if you develop either (1) an inability to urinate or have bowel movements, or (2) loss of your ability to control your bathroom functions (if you start having "accidents"), or if you develop other new symptoms that concern you.

## 2017-02-07 NOTE — ED Provider Notes (Signed)
[facility] EMERGENCY DEPARTMENT Provider Note   CSN: [id number] Arrival date & time: 02/07/17  1131     History   Chief Complaint Chief Complaint  Patient presents with  . Back Pain    HPI Alex [name] is a 51 y.o. male.  The history is provided by the patient and medical records. No language interpreter was used.  Back Pain   Pertinent negatives include no fever, no numbness, no headaches, no abdominal pain and no weakness.   Alex [name] is a 51 y.o. male  with a PMH of chronic back pain who presents to the Emergency Department complaining of low back c/w his typical flares of chronic back pain. Patient endorses aching, throbbing pain which began two weeks ago. He has tried motrin with little improvement. Pain worse with movement. Patient denies upper back or neck pain. No fever, saddle anesthesia, weakness, numbness, urinary complaints including retention/incontinence. No history of cancer, IVDU, or recent spinal procedures. [facility]. He states that he has a flare every few months and comes to the hospital where he receives pain shots. He typically will feel much better and then pain is manageable with OTC medication and heating pad.   Past Medical History:  Diagnosis Date  . Chronic back pain   . Hypertension   . Migraine   . PTSD (post-traumatic stress disorder)     Patient Active Problem List   Diagnosis Date Noted  . HEMORRHOIDS-INTERNAL 08/18/2009  . BLOOD IN STOOL-MELENA 08/18/2009  . PERSONAL HX COLONIC POLYPS 08/18/2009  . RECTAL BLEEDING 05/28/2009  . OSTEOARTHRITIS 05/28/2009  . DEPRESSION 05/22/2009  . HYPERTENSION 05/22/2009  . LOW BACK PAIN 05/22/2009  . HEADACHE 05/22/2009  . RECTAL BLEEDING, HX OF 05/22/2009    Past Surgical History:  Procedure Laterality Date  . BACK SURGERY         Home Medications    Prior to Admission medications   Medication Sig Start Date End Date Taking?  Authorizing Provider  hydrochlorothiazide (HYDRODIURIL) 12.5 MG tablet Take 12.5 mg by mouth daily.   Yes [provider]  lisinopril (PRINIVIL,ZESTRIL) 2.5 MG tablet Take 2.5 mg by mouth daily.   Yes [provider]  venlafaxine (EFFEXOR) 100 MG tablet Take 250 mg by mouth once.   Yes [provider]  HYDROcodone-acetaminophen (NORCO) 5-325 MG tablet Take 1 tablet by mouth every 4 (four) hours as needed (for pain). 12/13/16   Molpus, John, MD    Family History No family history on file.  Social History Social History  Substance Use Topics  . Smoking status: Never Smoker  . Smokeless tobacco: Never Used  . Alcohol use Yes     Comment: occ     Allergies   Trazodone and nefazodone   Review of Systems Review of Systems  Constitutional: Negative for chills and fever.  Gastrointestinal: Negative for abdominal pain, nausea and vomiting.  Genitourinary: Negative for difficulty urinating.  Musculoskeletal: Positive for back pain.  Skin: Negative for wound.  Neurological: Negative for dizziness, weakness, numbness and headaches.     Physical Exam Updated Vital Signs BP (!) 164/112 (BP Location: Right Arm)   Pulse (!) 51   Temp 98.6 F (37 C) (Oral)   Resp 16   Ht 5\' 7"  (1.702 m)   Wt 84.8 kg (187 lb)   SpO2 100%   BMI 29.29 kg/m   Physical Exam  Constitutional: He is oriented to person, place, and  time. He appears well-developed and well-nourished.  Neck:  No midline or paraspinal tenderness. Full ROM without pain.  Cardiovascular: Normal rate, regular rhythm, normal heart sounds and intact distal pulses.   Pulmonary/Chest: Effort normal and breath sounds normal. No respiratory distress.  Abdominal: Soft. Bowel sounds are normal. He exhibits no distension. There is no tenderness.  Musculoskeletal:  Tenderness to palpation across lumbar spine. 5/5 muscle strength in bilateral LE's. Straight leg raises are negative on bilaterally for radicular  symptoms. Able to ambulate independently with steady gait.  Neurological: He is alert and oriented to person, place, and time. He has normal reflexes.  Bilateral lower extremities neurovascularly intact.  Skin: Skin is warm and dry. No rash noted. No erythema.  Nursing note and vitals reviewed.    ED Treatments / Results  Labs (all labs ordered are listed, but only abnormal results are displayed) Labs Reviewed - No data to display  EKG  EKG Interpretation None       Radiology No results found.  Procedures Procedures (including critical care time)  Medications Ordered in ED Medications  ketorolac (TORADOL) 30 MG/ML injection 30 mg (30 mg Intramuscular Given 02/07/17 1346)  HYDROmorphone (DILAUDID) injection 1 mg (1 mg Intramuscular Given 02/07/17 1346)     Initial Impression / Assessment and Plan / ED Course  I have reviewed the triage vital signs and the nursing notes.  Pertinent labs & imaging results that were available during my care of the patient were reviewed by me and considered in my medical decision making (see chart for details).    Alex [name] is a 51 y.o. male who presents to ED for acute worsening of chronic low back pain.   Patient demonstrates no lower extremity weakness, saddle anesthesia, bowel or bladder incontinence or neuro deficits. No concern for cauda equina. No fevers or other infectious symptoms to suggest that the patient's back pain is due to an infection. Lower extremities are neurovascularly intact and patient is ambulating without difficulty. Given IM injections for pain. Continue with symptomatic home OTC pain regimen. I have reviewed return precautions, including the development of any of these signs or symptoms and the patient has voiced understanding. I reviewed symptomatic home care instructions and PCP follow-up if symptoms do not improve. Patient voiced understanding and agreement with plan. All questions answered.  Final Clinical  Impressions(s) / ED Diagnoses   Final diagnoses:  Chronic bilateral low back pain without sciatica    New Prescriptions Discharge Medication List as of 02/07/2017  1:57 PM       Josey Forcier, Chase Picket, PA-C 02/07/17 1514    Tilden Fossa, MD 02/07/17 [phone]

## 2017-02-07 NOTE — ED Triage Notes (Signed)
Lower back pain. Hx of chronic back pain. No injury.

## 2017-04-14 ENCOUNTER — Encounter (HOSPITAL_BASED_OUTPATIENT_CLINIC_OR_DEPARTMENT_OTHER): Payer: Self-pay

## 2017-04-14 ENCOUNTER — Emergency Department (HOSPITAL_BASED_OUTPATIENT_CLINIC_OR_DEPARTMENT_OTHER)
Admission: EM | Admit: 2017-04-14 | Discharge: 2017-04-14 | Disposition: A | Discharge status: Home / Self Care | Payer: Medicare Other | Attending: Emergency Medicine | Admitting: Emergency Medicine

## 2017-04-14 ENCOUNTER — Other Ambulatory Visit: Payer: Self-pay

## 2017-04-14 DIAGNOSIS — I10 Essential (primary) hypertension: Secondary | ICD-10-CM | POA: Diagnosis not present

## 2017-04-14 DIAGNOSIS — M5431 Sciatica, right side: Secondary | ICD-10-CM | POA: Diagnosis not present

## 2017-04-14 DIAGNOSIS — Z79899 Other long term (current) drug therapy: Secondary | ICD-10-CM | POA: Diagnosis not present

## 2017-04-14 DIAGNOSIS — M545 Low back pain: Secondary | ICD-10-CM | POA: Diagnosis present

## 2017-04-14 MED ORDER — KETOROLAC TROMETHAMINE 15 MG/ML IJ SOLN
30.0000 mg | Freq: Once | INTRAMUSCULAR | Status: AC
  Administered 2017-04-14: 30 mg via INTRAMUSCULAR
  Filled 2017-04-14: qty 2

## 2017-04-14 MED ORDER — HYDROMORPHONE HCL 1 MG/ML IJ SOLN
2.0000 mg | Freq: Once | INTRAMUSCULAR | Status: AC
  Administered 2017-04-14: 2 mg via INTRAMUSCULAR
  Filled 2017-04-14: qty 2

## 2017-04-14 NOTE — ED Triage Notes (Signed)
Pt c/o chronic right side back pain that flared up a few days ago that he has tried aspirin and ice for without relief.  Pt has been here monthly for narcotic pain shots for same problem, states he "hopefully" has an appointment with the neurosurgeon in the next few weeks at the [facility]

## 2017-04-14 NOTE — ED Notes (Signed)
ED Provider at bedside. 

## 2017-04-14 NOTE — ED Provider Notes (Signed)
[facility] Provider Note: Alex [name] Shonika Kolasinski, MD, FACEP  CSN: [id number] MRN: [id number] ARRIVAL: 04/14/17 at 0058 ROOM: [facility]10/[facility]10   CHIEF COMPLAINT  Back Pain   HISTORY OF PRESENT ILLNESS  04/14/17 3:35 AM Celene SquibbSean Trow is a 51 y.o. male with chronic back pain.  The back pain is on the right side and radiates down the back of his right leg.  He avoids taking narcotics at home but when his pain becomes severe he comes to the ED for a shot of analgesics.  He was last seen for this in October.  He rates his pain as an 8 out of 10 and describes the pain is sharp.  It is worse with movement at the right hip or with weightbearing of the right leg.  He denies any new numbness or weakness.  He denies bowel or bladder changes.  [facility] next 2-3 weeks.  Consultation with the [facility] [place] controlled substances database reveals the patient has received 1 prescription for hydrocodone in the past year.   Past Medical History:  Diagnosis Date  . Chronic back pain   . Hypertension   . Migraine   . PTSD (post-traumatic stress disorder)     Past Surgical History:  Procedure Laterality Date  . BACK SURGERY      No family history on file.  Social History   Tobacco Use  . Smoking status: Never Smoker  . Smokeless tobacco: Never Used  Substance Use Topics  . Alcohol use: Yes    Comment: occ  . Drug use: No    Prior to Admission medications   Medication Sig Start Date End Date Taking? Authorizing Provider  hydrochlorothiazide (HYDRODIURIL) 12.5 MG tablet Take 12.5 mg by mouth daily.    [provider]  HYDROcodone-acetaminophen (NORCO) 5-325 MG tablet Take 1 tablet by mouth every 4 (four) hours as needed (for pain). 12/13/16   Alok Minshall, MD  lisinopril (PRINIVIL,ZESTRIL) 2.5 MG tablet Take 2.5 mg by mouth daily.    [provider]  venlafaxine (EFFEXOR) 100 MG tablet Take 250 mg by mouth once.    [provider]    Allergies Trazodone and nefazodone   REVIEW OF SYSTEMS  Negative except as noted here or in the History of Present Illness.   PHYSICAL EXAMINATION  Initial Vital Signs Blood pressure 115/83, pulse (!) 53, temperature 98.1 F (36.7 C), temperature source Oral, resp. rate 18, height 5\' 7"  (1.702 m), weight 86.2 kg (190 lb), SpO2 100 %.  Examination General: Well-developed, well-nourished male in no acute distress; appearance consistent with age of record HENT: normocephalic; atraumatic Eyes: pupils equal, round and reactive to light; extraocular muscles intact Neck: supple Heart: regular rate and rhythm Lungs: clear to auscultation bilaterally Abdomen: soft; nondistended; nontender; bowel sounds present Back: Mild right paralumbar tenderness; positive straight leg raise on the right at 20 degrees Extremities: No deformity; full range of motion except right hip due to pain; pulses normal Neurologic: Awake, alert and oriented; motor function intact in all extremities and symmetric; no facial droop Skin: Warm and dry Psychiatric: Normal mood and affect   RESULTS  Summary of this visit's results, reviewed by myself:   EKG Interpretation  Date/Time:    Ventricular Rate:    PR Interval:    QRS Duration:   QT Interval:    QTC Calculation:   R Axis:     Text Interpretation:  Laboratory Studies: No results found for this or any previous visit (from the past 24 hour(s)). Imaging Studies: No results found.  ED COURSE  Nursing notes and initial vitals signs, including pulse oximetry, reviewed.  Vitals:   04/14/17 0105 04/14/17 0106  BP: 115/83   Pulse: (!) 53   Resp: 18   Temp: 98.1 F (36.7 C)   TempSrc: Oral   SpO2: 100%   Weight:  86.2 kg (190 lb)  Height:  5\' 7"  (1.702 m)    PROCEDURES    ED DIAGNOSES     ICD-10-CM   1. Sciatica of right side M54.31        Reeya Bound, MD 04/14/17 0345

## 2017-06-25 ENCOUNTER — Encounter (HOSPITAL_COMMUNITY): Payer: Self-pay

## 2017-06-25 ENCOUNTER — Emergency Department (HOSPITAL_COMMUNITY)
Admission: EM | Admit: 2017-06-25 | Discharge: 2017-06-26 | Disposition: A | Discharge status: Home / Self Care | Payer: Medicare Other | Attending: Emergency Medicine | Admitting: Emergency Medicine

## 2017-06-25 DIAGNOSIS — R197 Diarrhea, unspecified: Secondary | ICD-10-CM | POA: Diagnosis not present

## 2017-06-25 DIAGNOSIS — R112 Nausea with vomiting, unspecified: Secondary | ICD-10-CM | POA: Insufficient documentation

## 2017-06-25 DIAGNOSIS — R1013 Epigastric pain: Secondary | ICD-10-CM | POA: Diagnosis not present

## 2017-06-25 DIAGNOSIS — I10 Essential (primary) hypertension: Secondary | ICD-10-CM | POA: Insufficient documentation

## 2017-06-25 LAB — CBC WITH DIFFERENTIAL/PLATELET
Basophils Absolute: 0 10*3/uL (ref 0.0–0.1)
Basophils Relative: 0 %
Eosinophils Absolute: 0 10*3/uL (ref 0.0–0.7)
Eosinophils Relative: 0 %
HCT: 42.4 % (ref 39.0–52.0)
Hemoglobin: 14.9 g/dL (ref 13.0–17.0)
Lymphocytes Relative: 18 %
Lymphs Abs: 1.4 10*3/uL (ref 0.7–4.0)
MCH: 30.3 pg (ref 26.0–34.0)
MCHC: 35.1 g/dL (ref 30.0–36.0)
MCV: 86.4 fL (ref 78.0–100.0)
Monocytes Absolute: 0.4 10*3/uL (ref 0.1–1.0)
Monocytes Relative: 5 %
Neutro Abs: 5.9 10*3/uL (ref 1.7–7.7)
Neutrophils Relative %: 77 %
Platelets: 187 10*3/uL (ref 150–400)
RBC: 4.91 MIL/uL (ref 4.22–5.81)
RDW: 12.8 % (ref 11.5–15.5)
WBC: 7.7 10*3/uL (ref 4.0–10.5)

## 2017-06-25 LAB — COMPREHENSIVE METABOLIC PANEL
ALT: 28 U/L (ref 17–63)
AST: 27 U/L (ref 15–41)
Albumin: 4.5 g/dL (ref 3.5–5.0)
Alkaline Phosphatase: 56 U/L (ref 38–126)
Anion gap: 10 (ref 5–15)
BUN: 14 mg/dL (ref 6–20)
CO2: 27 mmol/L (ref 22–32)
Calcium: 9.3 mg/dL (ref 8.9–10.3)
Chloride: 104 mmol/L (ref 101–111)
Creatinine, Ser: 1.02 mg/dL (ref 0.61–1.24)
GFR calc Af Amer: >60 mL/min (ref >60)
GFR calc non Af Amer: >60 mL/min (ref >60)
Glucose, Bld: 128 mg/dL — ABNORMAL HIGH (ref 65–99)
Potassium: 3.4 mmol/L — ABNORMAL LOW (ref 3.5–5.1)
Sodium: 141 mmol/L (ref 135–145)
Total Bilirubin: 1.9 mg/dL — ABNORMAL HIGH (ref 0.3–1.2)
Total Protein: 7.7 g/dL (ref 6.5–8.1)

## 2017-06-25 LAB — LIPASE, BLOOD: Lipase: 27 U/L (ref 11–51)

## 2017-06-25 LAB — POC OCCULT BLOOD, ED: Fecal Occult Bld: NEGATIVE

## 2017-06-25 MED ORDER — DICYCLOMINE HCL 10 MG PO CAPS
10.0000 mg | ORAL_CAPSULE | Freq: Once | ORAL | Status: AC
  Administered 2017-06-25: 10 mg via ORAL
  Filled 2017-06-25: qty 1

## 2017-06-25 MED ORDER — SODIUM CHLORIDE 0.9 % IV BOLUS (SEPSIS)
2000.0000 mL | Freq: Once | INTRAVENOUS | Status: AC
  Administered 2017-06-25: 2000 mL via INTRAVENOUS

## 2017-06-25 MED ORDER — ONDANSETRON HCL 4 MG/2ML IJ SOLN
4.0000 mg | Freq: Once | INTRAMUSCULAR | Status: DC
  Filled 2017-06-25: qty 2

## 2017-06-25 MED ORDER — ONDANSETRON 4 MG PO TBDP
4.0000 mg | ORAL_TABLET | Freq: Once | ORAL | Status: DC
  Administered 2017-06-25: 4 mg via ORAL
  Filled 2017-06-25: qty 1

## 2017-06-25 NOTE — ED Notes (Signed)
Pt complains of vomiting and diarrhea after eating a veggie burger from a restaurant

## 2017-06-25 NOTE — ED Provider Notes (Signed)
[facility]-EMERGENCY DEPT Provider Note   CSN: [id number] Arrival date & time: 06/25/17  2035     History   Chief Complaint Chief Complaint  Patient presents with  . Emesis  . Diarrhea    HPI   Blood pressure (!) 163/113, pulse 81, temperature 99.4 F (37.4 C), temperature source Oral, resp. rate (!) 22, SpO2 100 %.  Alex [name] is a 52 y.o. male complaining of multiple episodes of nonbloody, nonbilious, non-coffee-ground emesis onset at midnight this a.m.  He is also had some diarrhea that he describes as dark.  Fever but he endorses chills with epigastric pain.  He thinks that he may have had food poisoning because he had a veggie burger at a restaurant last night he states that the veggie burgers are ordered so they are likely old.  He denies any other sick contacts.  He is passing flatus normally.  Past Medical History:  Diagnosis Date  . Chronic back pain   . Hypertension   . Migraine   . PTSD (post-traumatic stress disorder)     Patient Active Problem List   Diagnosis Date Noted  . HEMORRHOIDS-INTERNAL 08/18/2009  . BLOOD IN STOOL-MELENA 08/18/2009  . PERSONAL HX COLONIC POLYPS 08/18/2009  . RECTAL BLEEDING 05/28/2009  . OSTEOARTHRITIS 05/28/2009  . DEPRESSION 05/22/2009  . HYPERTENSION 05/22/2009  . LOW BACK PAIN 05/22/2009  . HEADACHE 05/22/2009  . RECTAL BLEEDING, HX OF 05/22/2009    Past Surgical History:  Procedure Laterality Date  . BACK SURGERY         Home Medications    Prior to Admission medications   Medication Sig Start Date End Date Taking? Authorizing Provider  hydrochlorothiazide (HYDRODIURIL) 12.5 MG tablet Take 12.5 mg by mouth daily.    [provider]  lisinopril (PRINIVIL,ZESTRIL) 2.5 MG tablet Take 2.5 mg by mouth daily.    [provider]  ondansetron (ZOFRAN) 4 MG tablet Take 1 tablet (4 mg total) by mouth every 8 (eight) hours as needed for nausea or vomiting. 06/26/17   Siah Steely, Joni ReiningNicole,  PA-C  venlafaxine (EFFEXOR) 100 MG tablet Take 250 mg by mouth once.    [provider]    Family History History reviewed. No pertinent family history.  Social History Social History   Tobacco Use  . Smoking status: Never Smoker  . Smokeless tobacco: Never Used  Substance Use Topics  . Alcohol use: Yes    Comment: occ  . Drug use: No     Allergies   Trazodone and nefazodone   Review of Systems Review of Systems  A complete review of systems was obtained and all systems are negative except as noted in the HPI and PMH.   Physical Exam Updated Vital Signs BP (!) 163/113   Pulse 81   Temp 99.4 F (37.4 C) (Oral)   Resp (!) 22   SpO2 100%   Physical Exam  Constitutional: He is oriented to person, place, and time. He appears well-developed and well-nourished. No distress.  HENT:  Head: Normocephalic and atraumatic.  Mouth/Throat: Oropharynx is clear and moist.  Eyes: Conjunctivae and EOM are normal. Pupils are equal, round, and reactive to light.  Neck: Normal range of motion.  Cardiovascular: Normal rate, regular rhythm and intact distal pulses.  Pulmonary/Chest: Effort normal and breath sounds normal.  Abdominal: Soft. There is no tenderness.  Musculoskeletal: Normal range of motion.  Neurological: He is alert and oriented to person, place, and time.  Skin: He is not diaphoretic.  Psychiatric: He has a normal mood and affect.  Nursing note and vitals reviewed.    ED Treatments / Results  Labs (all labs ordered are listed, but only abnormal results are displayed) Labs Reviewed  COMPREHENSIVE METABOLIC PANEL - Abnormal; Notable for the following components:      Result Value   Potassium 3.4 (*)    Glucose, Bld 128 (*)    Total Bilirubin 1.9 (*)    All other components within normal limits  GASTROINTESTINAL PANEL BY PCR, STOOL (REPLACES STOOL CULTURE)  CBC WITH DIFFERENTIAL/PLATELET  LIPASE, BLOOD  POC OCCULT BLOOD, ED    EKG  EKG  Interpretation None       Radiology No results found.  Procedures Procedures (including critical care time)  Medications Ordered in ED Medications  ondansetron (ZOFRAN) injection 4 mg (not administered)  sodium chloride 0.9 % bolus 2,000 mL (2,000 mLs Intravenous New Bag/Given 06/25/17 2323)  dicyclomine (BENTYL) capsule 10 mg (10 mg Oral Given 06/25/17 2323)     Initial Impression / Assessment and Plan / ED Course  I have reviewed the triage vital signs and the nursing notes.  Pertinent labs & imaging results that were available during my care of the patient were reviewed by me and considered in my medical decision making (see chart for details).     Vitals:   06/25/17 2037 06/25/17 2242  BP: (!) 158/108 (!) 163/113  Pulse: 74 81  Resp: 20 (!) 22  Temp: 99.4 F (37.4 C)   TempSrc: Oral   SpO2: 99% 100%    Medications  ondansetron (ZOFRAN) injection 4 mg (not administered)  sodium chloride 0.9 % bolus 2,000 mL (2,000 mLs Intravenous New Bag/Given 06/25/17 2323)  dicyclomine (BENTYL) capsule 10 mg (10 mg Oral Given 06/25/17 2323)    Alex [name] is 52 y.o. male presenting with vomiting diarrhea onset last night.  He states he is unable to keep anything down.  No focal abdominal pain, patient afebrile, nontoxic-appearing, blood pressure is elevated.  Patient states he is having melanotic stool however guaiac is negative.  Blood work reassuring, patient feels significantly better after hydration, Zofran and Bentyl.  Repeat abdominal exam remains benign, patient is tolerating p.o.'s and amenable to discharge.  He is not able to give [place] a sample to test for stool pathogens.  Evaluation does not show pathology that would require ongoing emergent intervention or inpatient treatment. Pt is hemodynamically stable and mentating appropriately. Discussed findings and plan with patient/guardian, who agrees with care plan. All questions answered. Return precautions discussed and outpatient  follow up given.    Final Clinical Impressions(s) / ED Diagnoses   Final diagnoses:  Nausea vomiting and diarrhea    ED Discharge Orders        Ordered    ondansetron (ZOFRAN) 4 MG tablet  Every 8 hours PRN     06/26/17 0055       Julie Nay, Joni Reining, PA-C 06/26/17 0056    Pricilla Loveless, MD 06/26/17 [phone]

## 2017-06-26 ENCOUNTER — Encounter (HOSPITAL_BASED_OUTPATIENT_CLINIC_OR_DEPARTMENT_OTHER): Payer: Self-pay | Admitting: *Deleted

## 2017-06-26 ENCOUNTER — Emergency Department (HOSPITAL_BASED_OUTPATIENT_CLINIC_OR_DEPARTMENT_OTHER)
Admission: EM | Admit: 2017-06-26 | Discharge: 2017-06-26 | Disposition: A | Discharge status: Home / Self Care | Payer: Medicare Other | Attending: Emergency Medicine | Admitting: Emergency Medicine

## 2017-06-26 ENCOUNTER — Other Ambulatory Visit: Payer: Self-pay

## 2017-06-26 DIAGNOSIS — M5441 Lumbago with sciatica, right side: Secondary | ICD-10-CM | POA: Insufficient documentation

## 2017-06-26 DIAGNOSIS — M5442 Lumbago with sciatica, left side: Secondary | ICD-10-CM | POA: Insufficient documentation

## 2017-06-26 DIAGNOSIS — G8929 Other chronic pain: Secondary | ICD-10-CM

## 2017-06-26 DIAGNOSIS — Z79899 Other long term (current) drug therapy: Secondary | ICD-10-CM | POA: Insufficient documentation

## 2017-06-26 DIAGNOSIS — M545 Low back pain: Secondary | ICD-10-CM | POA: Diagnosis present

## 2017-06-26 DIAGNOSIS — I10 Essential (primary) hypertension: Secondary | ICD-10-CM | POA: Insufficient documentation

## 2017-06-26 MED ORDER — ONDANSETRON HCL 4 MG PO TABS: 4.0000 mg | ORAL_TABLET | Freq: Three times a day (TID) | ORAL | 0 refills | Status: DC | PRN

## 2017-06-26 MED ORDER — KETOROLAC TROMETHAMINE 30 MG/ML IJ SOLN
30.0000 mg | Freq: Once | INTRAMUSCULAR | Status: AC
  Administered 2017-06-26: 30 mg via INTRAMUSCULAR
  Filled 2017-06-26: qty 1

## 2017-06-26 MED ORDER — HYDROMORPHONE HCL 1 MG/ML IJ SOLN: 0.5000 mg | Freq: Once | INTRAMUSCULAR | Status: DC

## 2017-06-26 MED ORDER — HYDROMORPHONE HCL 1 MG/ML IJ SOLN
0.5000 mg | Freq: Once | INTRAMUSCULAR | Status: AC
  Administered 2017-06-26: 0.5 mg via INTRAMUSCULAR
  Filled 2017-06-26: qty 1

## 2017-06-26 NOTE — Discharge Instructions (Signed)
While in the ED your blood pressure was high.  Please follow up with your primary care doctor or the wellness clinic for repeat evaluation as you may need medication.  High blood pressure can cause long term, potentially serious, damage if left untreated.   Today you received medications that may make you sleepy or impair your ability to make decisions.  For the next 24 hours please do not drive, operate heavy machinery, care for a small child with out another adult present, or perform any activities that may cause harm to you or someone else if you were to fall asleep or be impaired.

## 2017-06-26 NOTE — ED Triage Notes (Signed)
Back pain. States he "threw" his back out while vomiting.

## 2017-06-26 NOTE — Discharge Instructions (Signed)

## 2017-06-26 NOTE — ED Notes (Signed)
Pt does not have a responsible party with him at this time. Pt advised he has to have a responsible adult to escort pt home after a narcotic administration. Pt states he can call a friend. Pt advised that I can administer the dilaudid after his friend arrives.

## 2017-06-26 NOTE — ED Notes (Signed)
When asked about pt's ride, pt stated he is only a "few minutes away."

## 2017-06-26 NOTE — ED Notes (Signed)
Patient's BP is 166/113. Informed RN.

## 2017-06-26 NOTE — ED Provider Notes (Signed)
[facility] EMERGENCY DEPARTMENT Provider Note   CSN: [id number] Arrival date & time: 06/26/17  1250     History   Chief Complaint Chief Complaint  Patient presents with  . Back Pain    HPI Alex SquibbSean Appelhans is a 52 y.o. male with a history of chronic back pain for multiple years who presents today for evaluation of his chronic back pain.  He reports that his pain today is consistent with his usual pain involving bilateral sciatica worse on the left side than the right.  He reports that this feels exactly like his normal pain flareups.  He reports that yesterday he was sick with "food poisoning" and was having nausea vomiting and diarrhea.  He reports that while he was having one particularly forceful episode of vomiting he felt his back "give out.  He reports immediate onset of pain.  He denies any new numbness or tingling.  This pain feels similar in character, location, and nature to his usual back pain.  He has not concerned that there is something additional going on today, he reports that normally when his back pain flares up he comes here and gets a shot of Dilaudid and a shot of Toradol which makes him better for approximately 1-2 months.  HPI  Past Medical History:  Diagnosis Date  . Chronic back pain   . Hypertension   . Migraine   . PTSD (post-traumatic stress disorder)     Patient Active Problem List   Diagnosis Date Noted  . HEMORRHOIDS-INTERNAL 08/18/2009  . BLOOD IN STOOL-MELENA 08/18/2009  . PERSONAL HX COLONIC POLYPS 08/18/2009  . RECTAL BLEEDING 05/28/2009  . OSTEOARTHRITIS 05/28/2009  . DEPRESSION 05/22/2009  . HYPERTENSION 05/22/2009  . LOW BACK PAIN 05/22/2009  . HEADACHE 05/22/2009  . RECTAL BLEEDING, HX OF 05/22/2009    Past Surgical History:  Procedure Laterality Date  . BACK SURGERY         Home Medications    Prior to Admission medications   Medication Sig Start Date End Date Taking? Authorizing Provider  hydrochlorothiazide  (HYDRODIURIL) 12.5 MG tablet Take 12.5 mg by mouth daily.   Yes [provider]  lisinopril (PRINIVIL,ZESTRIL) 2.5 MG tablet Take 2.5 mg by mouth daily.   Yes [provider]  ondansetron (ZOFRAN) 4 MG tablet Take 1 tablet (4 mg total) by mouth every 8 (eight) hours as needed for nausea or vomiting. 06/26/17  Yes Pisciotta, Joni ReiningNicole, PA-C  venlafaxine (EFFEXOR) 100 MG tablet Take 250 mg by mouth once.   Yes [provider]    Family History No family history on file.  Social History Social History   Tobacco Use  . Smoking status: Never Smoker  . Smokeless tobacco: Never Used  Substance Use Topics  . Alcohol use: Yes    Comment: occ  . Drug use: No     Allergies   Trazodone and nefazodone   Review of Systems Review of Systems  Constitutional: Negative for chills and fever (Not today).  Eyes: Negative for visual disturbance.  Gastrointestinal: Negative for abdominal pain.       Nausea vomiting and diarrhea have all resolved.   Genitourinary: Negative for decreased urine volume, dysuria, flank pain and urgency.  Musculoskeletal: Negative for neck pain and neck stiffness.  Skin: Negative for color change and rash.  Neurological: Negative for weakness, numbness and headaches.     Physical Exam Updated Vital Signs BP (!) 160/117 (BP Location: Left Arm)   Pulse 62   Temp  98.4 F (36.9 C) (Oral)   Resp 17   Ht 5\' 7"  (1.702 m)   Wt 81.6 kg (180 lb)   SpO2 100%   BMI 28.19 kg/m   Physical Exam  Constitutional: He appears well-developed and well-nourished.  Neck: Normal range of motion. Neck supple.  Cardiovascular: Normal rate, regular rhythm and intact distal pulses.  2+ DP/PT pulses bilaterally.  Pulmonary/Chest: Effort normal. No respiratory distress.  Abdominal: Soft. He exhibits no distension. There is no tenderness.  Musculoskeletal:  There is localized tenderness to palpation over left lumbar spinal muscles and left gluteus/piriformis  areas.  Palpation of these areas both creates and exacerbates his pain.  Palpation of the sciatic notch worsens his radiating pain.  Neurological:  Incision intact to bilateral lower extremities.  Skin: Skin is warm and dry. He is not diaphoretic.  Nursing note and vitals reviewed.    ED Treatments / Results  Labs (all labs ordered are listed, but only abnormal results are displayed) Labs Reviewed - No data to display  EKG  EKG Interpretation  Date/Time:  Monday June 26 2017 14:28:20 EDT Ventricular Rate:  66 PR Interval:  162 QRS Duration: 92 QT Interval:  392 QTC Calculation: 410 R Axis:   0 Text Interpretation:  Normal sinus rhythm with sinus arrhythmia Incomplete right bundle branch block Nonspecific ST and T wave abnormality Abnormal ECG similar to previous Confirmed by Frederick Peers [phone]) on 06/26/2017 2:44:14 PM       Radiology No results found.  Procedures Procedures (including critical care time)  Medications Ordered in ED Medications  ketorolac (TORADOL) 30 MG/ML injection 30 mg (not administered)  HYDROmorphone (DILAUDID) injection 0.5 mg (not administered)     Initial Impression / Assessment and Plan / ED Course  I have reviewed the triage vital signs and the nursing notes.  Pertinent labs & imaging results that were available during my care of the patient were reviewed by me and considered in my medical decision making (see chart for details).     Patient with back pain.  No neurological deficits and normal neuro exam.  Patient can walk but states is painful.  No loss of bowel or bladder control.  No concern for cauda equina.  No fever, night sweats, weight loss, h/o cancer, IVDU.  RICE protocol and pain medicine indicated and discussed with patient.  He was informed that he needs to follow up with his doctor.  His fevers yesterday occurred in the setting of GI illness and have since fully resolved.    Final Clinical Impressions(s) / ED Diagnoses    Final diagnoses:  Chronic bilateral low back pain with bilateral sciatica    ED Discharge Orders    None       Norman Clay 06/26/17 1515    Little, Ambrose Finland, MD 06/27/17 [phone]

## 2017-06-27 ENCOUNTER — Other Ambulatory Visit: Payer: Self-pay

## 2017-06-27 ENCOUNTER — Emergency Department (HOSPITAL_BASED_OUTPATIENT_CLINIC_OR_DEPARTMENT_OTHER)
Admission: EM | Admit: 2017-06-27 | Discharge: 2017-06-27 | Disposition: A | Discharge status: Home / Self Care | Payer: Medicare Other | Attending: Emergency Medicine | Admitting: Emergency Medicine

## 2017-06-27 ENCOUNTER — Encounter (HOSPITAL_BASED_OUTPATIENT_CLINIC_OR_DEPARTMENT_OTHER): Payer: Self-pay | Admitting: Emergency Medicine

## 2017-06-27 DIAGNOSIS — Z79899 Other long term (current) drug therapy: Secondary | ICD-10-CM | POA: Diagnosis not present

## 2017-06-27 DIAGNOSIS — F419 Anxiety disorder, unspecified: Secondary | ICD-10-CM | POA: Insufficient documentation

## 2017-06-27 DIAGNOSIS — G8929 Other chronic pain: Secondary | ICD-10-CM | POA: Diagnosis not present

## 2017-06-27 DIAGNOSIS — M5442 Lumbago with sciatica, left side: Secondary | ICD-10-CM | POA: Diagnosis not present

## 2017-06-27 DIAGNOSIS — M545 Low back pain: Secondary | ICD-10-CM | POA: Diagnosis present

## 2017-06-27 DIAGNOSIS — I10 Essential (primary) hypertension: Secondary | ICD-10-CM | POA: Insufficient documentation

## 2017-06-27 MED ORDER — HYDROXYZINE HCL 25 MG PO TABS: 25.0000 mg | ORAL_TABLET | Freq: Four times a day (QID) | ORAL | 0 refills | Status: DC | PRN

## 2017-06-27 MED ORDER — KETOROLAC TROMETHAMINE 30 MG/ML IJ SOLN
30.0000 mg | Freq: Once | INTRAMUSCULAR | Status: AC
  Administered 2017-06-27: 30 mg via INTRAMUSCULAR
  Filled 2017-06-27: qty 1

## 2017-06-27 MED ORDER — HYDROMORPHONE HCL 1 MG/ML IJ SOLN
1.0000 mg | Freq: Once | INTRAMUSCULAR | Status: AC
  Administered 2017-06-27: 1 mg via INTRAMUSCULAR
  Filled 2017-06-27: qty 1

## 2017-06-27 NOTE — ED Triage Notes (Signed)
Ongoing L lower back pain. Seen yesterday and given an injection with relief. Pain came back during the night.

## 2017-06-27 NOTE — Discharge Instructions (Signed)
Continue taking all of your home medications.  While in the ED your blood pressure was high.  Please follow up with your primary care doctor or the wellness clinic for repeat evaluation as you may need medication.  High blood pressure can cause long term, potentially serious, damage if left untreated.   Today you received medications that may make you sleepy or impair your ability to make decisions.  For the next 24 hours please do not drive, operate heavy machinery, care for a small child with out another adult present, or perform any activities that may cause harm to you or someone else if you were to fall asleep or be impaired.   You are being prescribed a medication which may make you sleepy. Please follow up of listed precautions for at least 24 hours after taking one dose.

## 2017-06-27 NOTE — ED Provider Notes (Signed)
[facility] EMERGENCY DEPARTMENT Provider Note   CSN: [id number] Arrival date & time: 06/27/17  1547     History   Chief Complaint Chief Complaint  Patient presents with  . Back Pain    HPI Alex [name] is a 52 y.o. male with a history of chronic back pain for multiple years who presents today for evaluation of his chronic back pain.  He reports that his pain today is consistent with his usual pain involving bilateral sciatica worse on the left side than the right.  He reports that this feels exactly like his normal pain flareups.  He was seen yesterday in the emergency room and treated with Dilaudid and Toradol IM.  He reports that this provided relief for approximately 8-12 hours.  He reports that then today he had a very stressful event and since then has pain has been worse.  No nausea vomiting diarrhea, no fevers or chills.   He denies any new numbness or tingling.  This pain feels similar in character, location, and nature to his usual back pain.  He has not concerned that there is something additional going on today, he reports that normally when his back pain flares up he comes here and gets a shot of Dilaudid and a shot of Toradol which makes him better for approximately 1-2 months.  He reports that at court today he found out that his children's mother is trying to take custody from him.  He reports that he has already scheduled an appointment with his councilor to discuss this but his mind is "racing."  He reports compliance with his psychiatric medications.   He denies SI. HI, or AVH.  He reports that he can be safe at home and does not have concerns about his ability to keep him self and others safe.  He states he knows he is going to have a hard time sleeping and would like something to help him sleep tonight.     Past Medical History:  Diagnosis Date  . Chronic back pain   . Hypertension   . Migraine   . PTSD (post-traumatic stress disorder)     Patient Active  Problem List   Diagnosis Date Noted  . HEMORRHOIDS-INTERNAL 08/18/2009  . BLOOD IN STOOL-MELENA 08/18/2009  . PERSONAL HX COLONIC POLYPS 08/18/2009  . RECTAL BLEEDING 05/28/2009  . OSTEOARTHRITIS 05/28/2009  . DEPRESSION 05/22/2009  . HYPERTENSION 05/22/2009  . LOW BACK PAIN 05/22/2009  . HEADACHE 05/22/2009  . RECTAL BLEEDING, HX OF 05/22/2009    Past Surgical History:  Procedure Laterality Date  . BACK SURGERY         Home Medications    Prior to Admission medications   Medication Sig Start Date End Date Taking? Authorizing Provider  hydrochlorothiazide (HYDRODIURIL) 12.5 MG tablet Take 12.5 mg by mouth daily.    [provider]  hydrOXYzine (ATARAX/VISTARIL) 25 MG tablet Take 1 tablet (25 mg total) by mouth every 6 (six) hours as needed for anxiety. 06/27/17   Cristina Gong, PA-C  lisinopril (PRINIVIL,ZESTRIL) 2.5 MG tablet Take 2.5 mg by mouth daily.    [provider]  ondansetron (ZOFRAN) 4 MG tablet Take 1 tablet (4 mg total) by mouth every 8 (eight) hours as needed for nausea or vomiting. 06/26/17   Pisciotta, Joni Reining, PA-C  venlafaxine (EFFEXOR) 100 MG tablet Take 250 mg by mouth once.    [provider]    Family History No family history on file.  Social History Social History  Tobacco Use  . Smoking status: Never Smoker  . Smokeless tobacco: Never Used  Substance Use Topics  . Alcohol use: Yes    Comment: occ  . Drug use: No     Allergies   Trazodone and nefazodone   Review of Systems Review of Systems  Constitutional: Negative for chills and fever.  Eyes: Negative for visual disturbance.  Gastrointestinal: Negative for abdominal pain.  Genitourinary: Negative for decreased urine volume, dysuria, flank pain and urgency.  Musculoskeletal: Positive for back pain. Negative for neck pain and neck stiffness.  Skin: Negative for color change and rash.  Neurological: Negative for weakness, numbness and headaches.      Physical Exam Updated Vital Signs BP (!) 157/108 (BP Location: Right Arm)   Pulse (!) 59   Temp 99 F (37.2 C) (Oral)   Resp 18   Ht 5\' 7"  (1.702 m)   Wt 81.6 kg (180 lb)   SpO2 99%   BMI 28.19 kg/m   Physical Exam  Constitutional: He appears well-developed and well-nourished.  Neck: Normal range of motion. Neck supple.  Cardiovascular: Normal rate, regular rhythm and intact distal pulses.  2+ DP/PT pulses bilaterally.  Pulmonary/Chest: Effort normal. No respiratory distress.  Abdominal: Soft. He exhibits no distension. There is no tenderness.  Musculoskeletal:  There is localized tenderness to palpation over left lumbar spinal muscles and left gluteus/piriformis areas.  Palpation of these areas both creates and exacerbates his pain.  Palpation of the sciatic notch worsens his radiating pain.  Gait is with slight limp.    Exam unchanged from when I examined him yesterday.   Neurological:  Incision intact to bilateral lower extremities.  Skin: Skin is warm and dry. He is not diaphoretic.  Nursing note and vitals reviewed.    ED Treatments / Results  Labs (all labs ordered are listed, but only abnormal results are displayed) Labs Reviewed - No data to display  EKG  EKG Interpretation None       Radiology No results found.  Procedures Procedures  (including critical care time)  Medications Ordered in ED Medications  HYDROmorphone (DILAUDID) injection 1 mg (not administered)  ketorolac (TORADOL) 30 MG/ML injection 30 mg (not administered)     Initial Impression / Assessment and Plan / ED Course  I have reviewed the triage vital signs and the nursing notes.  Pertinent labs & imaging results that were available during my care of the patient were reviewed by me and considered in my medical decision making (see chart for details).     Patient with back pain.  No neurological deficits and normal neuro exam.  Patient can walk but states is painful.  No  loss of bowel or bladder control.  No concern for cauda equina.  No fever, night sweats, weight loss, h/o cancer, IVDU.  RICE protocol and pain medicine indicated and discussed with patient.  He was informed that he needs to follow up with his doctor.  He was informed that he needs to follow up as an outpatient for his pain and see his doctor.  He was also informed that his BP is elevated and he needs to get evaluated for this.     Regarding his reported anxiety He denies Si, HI, or AVH.  He has aranged an appointment with his psychiatrist.  He will be given rx for PRN atarax.  He was informed that this medication may make him sleepy.  The importance of returning if any safety concerns and to follow up  were discussed and he stated his understanding.     Final Clinical Impressions(s) / ED Diagnoses   Final diagnoses:  Chronic bilateral low back pain with left-sided sciatica  Anxiety    ED Discharge Orders        Ordered    hydrOXYzine (ATARAX/VISTARIL) 25 MG tablet  Every 6 hours PRN     06/27/17 1819          Cristina Gong, PA-C 06/27/17 1927    Rolan Bucco, MD 06/27/17 2355

## 2017-06-27 NOTE — ED Notes (Signed)
Pt. Signe and verbalized d/c instructions but sig pad not working well

## 2017-10-16 ENCOUNTER — Encounter (HOSPITAL_BASED_OUTPATIENT_CLINIC_OR_DEPARTMENT_OTHER): Payer: Self-pay | Admitting: *Deleted

## 2017-10-16 ENCOUNTER — Other Ambulatory Visit: Payer: Self-pay

## 2017-10-16 ENCOUNTER — Emergency Department (HOSPITAL_BASED_OUTPATIENT_CLINIC_OR_DEPARTMENT_OTHER)
Admission: EM | Admit: 2017-10-16 | Discharge: 2017-10-16 | Disposition: A | Discharge status: Home / Self Care | Payer: Medicare Other | Attending: Emergency Medicine | Admitting: Emergency Medicine

## 2017-10-16 DIAGNOSIS — I10 Essential (primary) hypertension: Secondary | ICD-10-CM | POA: Diagnosis not present

## 2017-10-16 DIAGNOSIS — Z79899 Other long term (current) drug therapy: Secondary | ICD-10-CM | POA: Insufficient documentation

## 2017-10-16 DIAGNOSIS — M545 Low back pain, unspecified: Secondary | ICD-10-CM

## 2017-10-16 DIAGNOSIS — G8929 Other chronic pain: Secondary | ICD-10-CM | POA: Insufficient documentation

## 2017-10-16 MED ORDER — GABAPENTIN 300 MG PO CAPS: 300.0000 mg | ORAL_CAPSULE | Freq: Three times a day (TID) | ORAL | 0 refills | Status: DC

## 2017-10-16 MED ORDER — NAPROXEN 500 MG PO TABS: 500.0000 mg | ORAL_TABLET | Freq: Two times a day (BID) | ORAL | 0 refills | Status: DC

## 2017-10-16 MED ORDER — HYDROCODONE-ACETAMINOPHEN 5-325 MG PO TABS
1.0000 | ORAL_TABLET | Freq: Once | ORAL | Status: AC
  Administered 2017-10-16: 1 via ORAL
  Filled 2017-10-16: qty 1

## 2017-10-16 MED ORDER — CYCLOBENZAPRINE HCL 5 MG PO TABS
5.0000 mg | ORAL_TABLET | Freq: Once | ORAL | Status: AC
  Administered 2017-10-16: 5 mg via ORAL
  Filled 2017-10-16: qty 1

## 2017-10-16 MED ORDER — KETOROLAC TROMETHAMINE 30 MG/ML IJ SOLN
30.0000 mg | Freq: Once | INTRAMUSCULAR | Status: AC
  Administered 2017-10-16: 30 mg via INTRAMUSCULAR
  Filled 2017-10-16: qty 1

## 2017-10-16 NOTE — ED Triage Notes (Signed)
Pt states he has a hx of chronic back pain since 1988 and has occasional flare ups. Feels like this is one of those. Usually gets Dilaudid 1 mg and Toradol. States he will hopefully have [facility] at [facility] in about 4 months.

## 2017-10-16 NOTE — ED Notes (Signed)
Pt and FM given d/c instructions as per chart. Rx x 2. Verbalizes understanding. No questions. 

## 2017-10-16 NOTE — ED Provider Notes (Signed)
[facility] EMERGENCY DEPARTMENT Provider Note   CSN: [id number] Arrival date & time: 10/16/17  6962     History   Chief Complaint Chief Complaint  Patient presents with  . Back Pain    HPI Alex [name] is a 52 y.o. male.  HPI  This is a 52 year old male with a history of chronic back pain, hypertension, migraines who presents with low back pain.  Patient reports that he has had back pain since 1988.  He had a surgery in 1999 with discectomy.  He reports that he has had persistent and continued pain.  He reports that he needs another surgery at the [place].  He states over the last 2 days he has had increasing lower back pain.  Denies radiation of the pain at this time.  He states it is bilateral and bandlike.  It is currently 8 out of 10.  He has taken Motrin at home with minimal relief.  He denies any bowel or bladder difficulties.  He denies any weakness, numbness, tingling of his lower extremities.  Pain is consistent with prior episodes of pain.  At times he does have symptoms of sciatica as well.  Patient states "I normally come in and get a shot of Dilaudid and Toradol."  Of note, I have reviewed the patient's chart.  He has had frequent visits for similar back pain in the past.  In the last 2 years he has had approximately 14 ED visits based on my chart review.  Past Medical History:  Diagnosis Date  . Chronic back pain   . Hypertension   . Migraine   . PTSD (post-traumatic stress disorder)     Patient Active Problem List   Diagnosis Date Noted  . HEMORRHOIDS-INTERNAL 08/18/2009  . BLOOD IN STOOL-MELENA 08/18/2009  . PERSONAL HX COLONIC POLYPS 08/18/2009  . RECTAL BLEEDING 05/28/2009  . OSTEOARTHRITIS 05/28/2009  . DEPRESSION 05/22/2009  . HYPERTENSION 05/22/2009  . LOW BACK PAIN 05/22/2009  . HEADACHE 05/22/2009  . RECTAL BLEEDING, HX OF 05/22/2009    Past Surgical History:  Procedure Laterality Date  . BACK SURGERY          Home Medications     Prior to Admission medications   Medication Sig Start Date End Date Taking? Authorizing Provider  gabapentin (NEURONTIN) 300 MG capsule Take 1 capsule (300 mg total) by mouth 3 (three) times daily. 10/16/17   Jaedyn Lard, Mayer Masker, MD  hydrochlorothiazide (HYDRODIURIL) 12.5 MG tablet Take 12.5 mg by mouth daily.    [provider]  hydrOXYzine (ATARAX/VISTARIL) 25 MG tablet Take 1 tablet (25 mg total) by mouth every 6 (six) hours as needed for anxiety. 06/27/17   Cristina Gong, PA-C  lisinopril (PRINIVIL,ZESTRIL) 2.5 MG tablet Take 2.5 mg by mouth daily.    [provider]  naproxen (NAPROSYN) 500 MG tablet Take 1 tablet (500 mg total) by mouth 2 (two) times daily. 10/16/17   Kevontay Burks, Mayer Masker, MD  ondansetron (ZOFRAN) 4 MG tablet Take 1 tablet (4 mg total) by mouth every 8 (eight) hours as needed for nausea or vomiting. 06/26/17   Pisciotta, Joni Reining, PA-C  venlafaxine (EFFEXOR) 100 MG tablet Take 250 mg by mouth once.    [provider]    Family History History reviewed. No pertinent family history.  Social History Social History   Tobacco Use  . Smoking status: Never Smoker  . Smokeless tobacco: Never Used  Substance Use Topics  . Alcohol use: Yes    Comment:  occ  . Drug use: No     Allergies   Trazodone and nefazodone   Review of Systems Review of Systems  Constitutional: Negative for fever.  Respiratory: Negative for shortness of breath.   Cardiovascular: Negative for chest pain.  Gastrointestinal: Negative for nausea and vomiting.  Genitourinary: Negative for difficulty urinating.  Musculoskeletal: Positive for back pain.  Neurological: Negative for weakness and numbness.  All other systems reviewed and are negative.    Physical Exam Updated Vital Signs BP (!) 159/99 (BP Location: Left Arm)   Pulse (!) 47   Temp 98.2 F (36.8 C) (Oral)   Resp 20   Ht 5\' 7"  (1.702 m)   Wt 83.9 kg (185 lb)   SpO2 100%   BMI 28.98 kg/m    Physical Exam  Constitutional: He is oriented to person, place, and time. He appears well-developed and well-nourished. No distress.  HENT:  Head: Normocephalic and atraumatic.  Neck: Neck supple.  Cardiovascular: Normal rate, regular rhythm and normal heart sounds.  No murmur heard. Pulmonary/Chest: Effort normal and breath sounds normal. No respiratory distress. He has no wheezes.  Abdominal: Soft. There is no tenderness.  Musculoskeletal: He exhibits no edema.  Tenderness to palpation lowers lumbar spine, no step-off or deformity noted, grays, 5 out of 5 strength with dorsi and plantar flexion bilaterally as well as knee flexion and extension, no clonus, normal patellar reflexes bilaterally  Neurological: He is alert and oriented to person, place, and time.  Skin: Skin is warm and dry.  Psychiatric: He has a normal mood and affect.  Nursing note and vitals reviewed.    ED Treatments / Results  Labs (all labs ordered are listed, but only abnormal results are displayed) Labs Reviewed - No data to display  EKG None  Radiology No results found.  Procedures Procedures (including critical care time)  Medications Ordered in ED Medications  HYDROcodone-acetaminophen (NORCO/VICODIN) 5-325 MG per tablet 1 tablet (has no administration in time range)  ketorolac (TORADOL) 30 MG/ML injection 30 mg (has no administration in time range)  cyclobenzaprine (FLEXERIL) tablet 5 mg (has no administration in time range)     Initial Impression / Assessment and Plan / ED Course  I have reviewed the triage vital signs and the nursing notes.  Pertinent labs & imaging results that were available during my care of the patient were reviewed by me and considered in my medical decision making (see chart for details).     Patient presents with acute on chronic back pain.  Multiple episodes and visits for the same in the past.  He is overall nontoxic-appearing and vital signs are reassuring.  He  is neurologically intact.  No signs or symptoms of cauda equina.  No red flags.  Patient is requesting Dilaudid and Toradol.  I discussed with him that Dilaudid is not standard of care and that while I am happy to treat his pain, he would not receive Dilaudid.  I have ordered Toradol, Percocet, and Flexeril.  Patient expressed displeasure.  He states "but this is part of my pain management.  [facility] when I flare."  I discussed with him that using the emergency room for routine pain management is not appropriate.  He states "so you are not to give me Dilaudid."  Again I discussed with him that I was happy to treat his pain but that he would not be receiving IV Dilaudid.  He was referred  back to the [place] for pain management referral and further evaluation.  After history, exam, and medical workup I feel the patient has been appropriately medically screened and is safe for discharge home. Pertinent diagnoses were discussed with the patient. Patient was given return precautions.   Final Clinical Impressions(s) / ED Diagnoses   Final diagnoses:  Chronic bilateral low back pain without sciatica    ED Discharge Orders        Ordered    naproxen (NAPROSYN) 500 MG tablet  2 times daily     10/16/17 0408    gabapentin (NEURONTIN) 300 MG capsule  3 times daily     10/16/17 0408       Ainsley Sanguinetti, Mayer Masker, MD 10/16/17 [phone]

## 2017-10-16 NOTE — Discharge Instructions (Addendum)
You were seen today for back pain.  Your pain is chronic in nature.  Continue anti-inflammatory medication such as ibuprofen or naproxen at home.  Gabapentin will be added to your pain regimen.  While we are happy to treat your pain in the ED, it is inappropriate to use the ED for routine pain management.  [facility].

## 2017-12-14 ENCOUNTER — Emergency Department (HOSPITAL_BASED_OUTPATIENT_CLINIC_OR_DEPARTMENT_OTHER)
Admission: EM | Admit: 2017-12-14 | Discharge: 2017-12-14 | Disposition: A | Discharge status: Home / Self Care | Payer: Medicare Other | Attending: Emergency Medicine | Admitting: Emergency Medicine

## 2017-12-14 ENCOUNTER — Encounter (HOSPITAL_BASED_OUTPATIENT_CLINIC_OR_DEPARTMENT_OTHER): Payer: Self-pay | Admitting: Emergency Medicine

## 2017-12-14 ENCOUNTER — Other Ambulatory Visit: Payer: Self-pay

## 2017-12-14 DIAGNOSIS — M545 Low back pain, unspecified: Secondary | ICD-10-CM

## 2017-12-14 DIAGNOSIS — Z79899 Other long term (current) drug therapy: Secondary | ICD-10-CM | POA: Insufficient documentation

## 2017-12-14 DIAGNOSIS — G8929 Other chronic pain: Secondary | ICD-10-CM

## 2017-12-14 DIAGNOSIS — I10 Essential (primary) hypertension: Secondary | ICD-10-CM | POA: Diagnosis not present

## 2017-12-14 MED ORDER — HYDROMORPHONE HCL 1 MG/ML IJ SOLN
2.0000 mg | Freq: Once | INTRAMUSCULAR | Status: AC
  Administered 2017-12-14: 2 mg via INTRAMUSCULAR
  Filled 2017-12-14: qty 2

## 2017-12-14 NOTE — ED Triage Notes (Signed)
Pt c/o lower back pain which is chronic. Pt was just seen at [facility] prior to coming here. Pt states his ride is waiting in car with kids

## 2017-12-14 NOTE — ED Provider Notes (Signed)
[facility] Provider Note: Alex DellJ. Lane Tabitha Riggins, MD, FACEP  CSN: [id number] MRN: [id number] ARRIVAL: 12/14/17 at 0431 ROOM: MH01/MH01   CHIEF COMPLAINT  Back Pain   HISTORY OF PRESENT ILLNESS  12/14/17 4:48 AM Alex [name] is a 52 y.o. male history of chronic back pain.  He is currently negotiating with the [employer] for definitive surgical treatment of his back.  He is here with an acute exacerbation.  He was seen at [facility] in [place] earlier this morning where they gave him a shot of Toradol but would not give him a shot of Dilaudid which she requested.  He is here requesting a shot of Dilaudid so he can get some rest before court appearance later today.  He is not requesting a prescription for narcotics and review of the state controlled substance database indicates he has had only one opioid pain prescription in the past 2 years.  His pain is located across the lumbar region and right thoracic region.  It is worse with movement or lifting.  He denies numbness or weakness.  He describes it as severe.   Past Medical History:  Diagnosis Date  . Chronic back pain   . Hypertension   . Migraine   . PTSD (post-traumatic stress disorder)     Past Surgical History:  Procedure Laterality Date  . BACK SURGERY      No family history on file.  Social History   Tobacco Use  . Smoking status: Never Smoker  . Smokeless tobacco: Never Used  Substance Use Topics  . Alcohol use: Yes    Comment: occ  . Drug use: No    Prior to Admission medications   Medication Sig Start Date End Date Taking? Authorizing Provider  gabapentin (NEURONTIN) 300 MG capsule Take 1 capsule (300 mg total) by mouth 3 (three) times daily. 10/16/17   Horton, Mayer Maskerourtney F, MD  hydrochlorothiazide (HYDRODIURIL) 12.5 MG tablet Take 12.5 mg by mouth daily.    [provider]  hydrOXYzine (ATARAX/VISTARIL) 25 MG tablet Take 1 tablet (25 mg total) by mouth every 6 (six) hours as  needed for anxiety. 06/27/17   Cristina GongHammond, Elizabeth W, PA-C  lisinopril (PRINIVIL,ZESTRIL) 2.5 MG tablet Take 2.5 mg by mouth daily.    [provider]  naproxen (NAPROSYN) 500 MG tablet Take 1 tablet (500 mg total) by mouth 2 (two) times daily. 10/16/17   Horton, Mayer Maskerourtney F, MD  ondansetron (ZOFRAN) 4 MG tablet Take 1 tablet (4 mg total) by mouth every 8 (eight) hours as needed for nausea or vomiting. 06/26/17   Pisciotta, Joni ReiningNicole, PA-C  venlafaxine (EFFEXOR) 100 MG tablet Take 250 mg by mouth once.    [provider]    Allergies Trazodone and nefazodone   REVIEW OF SYSTEMS  Negative except as noted here or in the History of Present Illness.   PHYSICAL EXAMINATION  Initial Vital Signs Blood pressure (!) 162/110, pulse (!) 59, temperature 98.4 F (36.9 C), temperature source Oral, resp. rate 16, height 5\' 7"  (1.702 m), weight 81.6 kg, SpO2 100 %.  Examination General: Well-developed, well-nourished male in no acute distress; appearance consistent with age of record HENT: normocephalic; atraumatic Eyes: Normal appearance Neck: supple Heart: regular rate and rhythm Lungs: clear to auscultation bilaterally Abdomen: soft; nondistended; nontender; bowel sounds present Back: Paralumbar tenderness, greater on the left Extremities: No deformity; normal range of motion Neurologic: Awake, alert and oriented; motor function intact in all extremities and symmetric; no facial droop Skin: Warm and  dry Psychiatric: Flat affect   RESULTS  Summary of this visit's results, reviewed by myself:   EKG Interpretation  Date/Time:    Ventricular Rate:    PR Interval:    QRS Duration:   QT Interval:    QTC Calculation:   R Axis:     Text Interpretation:        Laboratory Studies: No results found for this or any previous visit (from the past 24 hour(s)). Imaging Studies: No results found.  ED COURSE and MDM  Nursing notes and initial vitals signs, including pulse oximetry,  reviewed.  Vitals:   12/14/17 0437 12/14/17 0439  BP: (!) 162/110   Pulse: (!) 59   Resp: 16   Temp: 98.4 F (36.9 C)   TempSrc: Oral   SpO2: 100%   Weight:  81.6 kg  Height:  5\' 7"  (1.702 m)   We will give patient a shot of Dilaudid.  As noted he is not requesting prescription.  PROCEDURES    ED DIAGNOSES     ICD-10-CM   1. Chronic bilateral low back pain without sciatica M54.5    G89.29        Saad Buhl, Jonny Ruiz, MD 12/14/17 [phone]

## 2017-12-24 ENCOUNTER — Emergency Department (HOSPITAL_BASED_OUTPATIENT_CLINIC_OR_DEPARTMENT_OTHER)
Admission: EM | Admit: 2017-12-24 | Discharge: 2017-12-25 | Disposition: A | Discharge status: Home / Self Care | Payer: Medicare Other | Attending: Emergency Medicine | Admitting: Emergency Medicine

## 2017-12-24 ENCOUNTER — Other Ambulatory Visit: Payer: Self-pay

## 2017-12-24 ENCOUNTER — Encounter (HOSPITAL_BASED_OUTPATIENT_CLINIC_OR_DEPARTMENT_OTHER): Payer: Self-pay | Admitting: *Deleted

## 2017-12-24 DIAGNOSIS — G8929 Other chronic pain: Secondary | ICD-10-CM | POA: Diagnosis not present

## 2017-12-24 DIAGNOSIS — M545 Low back pain, unspecified: Secondary | ICD-10-CM

## 2017-12-24 DIAGNOSIS — I10 Essential (primary) hypertension: Secondary | ICD-10-CM | POA: Insufficient documentation

## 2017-12-24 MED ORDER — HYDROMORPHONE HCL 1 MG/ML IJ SOLN
1.0000 mg | Freq: Once | INTRAMUSCULAR | Status: AC
  Administered 2017-12-24: 1 mg via INTRAMUSCULAR
  Filled 2017-12-24: qty 1

## 2017-12-24 MED ORDER — HYDROCHLOROTHIAZIDE 25 MG PO TABS: 25.0000 mg | ORAL_TABLET | Freq: Once | ORAL | Status: DC

## 2017-12-24 MED ORDER — HYDROMORPHONE HCL 1 MG/ML IJ SOLN: 1.0000 mg | Freq: Once | INTRAMUSCULAR | Status: DC

## 2017-12-24 MED ORDER — LISINOPRIL 10 MG PO TABS
10.0000 mg | ORAL_TABLET | Freq: Once | ORAL | Status: AC
  Administered 2017-12-24: 10 mg via ORAL
  Filled 2017-12-24: qty 1

## 2017-12-24 MED ORDER — ONDANSETRON 4 MG PO TBDP
4.0000 mg | ORAL_TABLET | Freq: Once | ORAL | Status: AC
  Administered 2017-12-24: 4 mg via ORAL
  Filled 2017-12-24: qty 1

## 2017-12-24 NOTE — ED Notes (Signed)
ED Provider at bedside. 

## 2017-12-24 NOTE — ED Provider Notes (Signed)
[facility] EMERGENCY DEPARTMENT Provider Note   CSN: [id number] Arrival date & time: 12/24/17  1924     History   Chief Complaint Chief Complaint  Patient presents with  . Back Pain  . Hypertension    HPI Alex [name] is a 52 y.o. male history of hypertension, PTSD, chronic back pain here presenting with back pain, hypertension.  Patient states that he is in the process of changing his pain management doctor from [place] to another doctor.  Patient states that his pain got worse today but denies any injury or trauma to his back.  He states that when he gets this bad he usually needs a shot of Dilaudid for pain.  Denies any pain radiating down his legs or trouble urinating.  He states that he usually gets very hypertensive when he is in pain and is compliant with his blood pressure medicines.  He denies any chest pain or shortness of breath.  Patient does admit to some headaches but that is common for him. States that he is getting referred to a spine doctor for more definite treatment.   The history is provided by the patient.    Past Medical History:  Diagnosis Date  . Chronic back pain   . Hypertension   . Migraine   . PTSD (post-traumatic stress disorder)     Patient Active Problem List   Diagnosis Date Noted  . HEMORRHOIDS-INTERNAL 08/18/2009  . BLOOD IN STOOL-MELENA 08/18/2009  . PERSONAL HX COLONIC POLYPS 08/18/2009  . RECTAL BLEEDING 05/28/2009  . OSTEOARTHRITIS 05/28/2009  . DEPRESSION 05/22/2009  . HYPERTENSION 05/22/2009  . LOW BACK PAIN 05/22/2009  . HEADACHE 05/22/2009  . RECTAL BLEEDING, HX OF 05/22/2009    Past Surgical History:  Procedure Laterality Date  . BACK SURGERY          Home Medications    Prior to Admission medications   Medication Sig Start Date End Date Taking? Authorizing Provider  gabapentin (NEURONTIN) 300 MG capsule Take 1 capsule (300 mg total) by mouth 3 (three) times daily. 10/16/17  Yes Horton, Mayer Masker, MD    hydrochlorothiazide (HYDRODIURIL) 12.5 MG tablet Take 12.5 mg by mouth daily.   Yes [provider]  lisinopril (PRINIVIL,ZESTRIL) 2.5 MG tablet Take 2.5 mg by mouth daily.   Yes [provider]  nabumetone (RELAFEN) 500 MG tablet Take 500 mg by mouth daily.   Yes [provider]  naproxen (NAPROSYN) 500 MG tablet Take 1 tablet (500 mg total) by mouth 2 (two) times daily. 10/16/17  Yes Horton, Mayer Masker, MD  ondansetron (ZOFRAN) 4 MG tablet Take 1 tablet (4 mg total) by mouth every 8 (eight) hours as needed for nausea or vomiting. 06/26/17  Yes Pisciotta, Joni Reining, PA-C  venlafaxine (EFFEXOR) 100 MG tablet Take 250 mg by mouth once.   Yes [provider]  hydrOXYzine (ATARAX/VISTARIL) 25 MG tablet Take 1 tablet (25 mg total) by mouth every 6 (six) hours as needed for anxiety. 06/27/17   Cristina Gong, PA-C    Family History No family history on file.  Social History Social History   Tobacco Use  . Smoking status: Never Smoker  . Smokeless tobacco: Never Used  Substance Use Topics  . Alcohol use: Yes    Comment: occ  . Drug use: No     Allergies   Trazodone and nefazodone   Review of Systems Review of Systems  Musculoskeletal: Positive for back pain.  All other systems reviewed and are negative.  Physical Exam Updated Vital Signs BP (!) 168/115 (BP Location: Right Arm)   Pulse 68   Temp 98.6 F (37 C) (Oral)   Resp 15   Ht 5\' 7"  (1.702 m)   Wt 81.6 kg   SpO2 100%   BMI 28.19 kg/m   Physical Exam  Constitutional:  Uncomfortable   HENT:  Head: Normocephalic.  Eyes: Pupils are equal, round, and reactive to light. Conjunctivae and EOM are normal.  Neck: Normal range of motion. Neck supple.  Cardiovascular: Normal rate, regular rhythm and normal heart sounds.  Pulmonary/Chest: Effort normal and breath sounds normal. No stridor. No respiratory distress.  Abdominal: Soft. Bowel sounds are normal. He exhibits no distension.  There is no tenderness.  Musculoskeletal: Normal range of motion.  Mild bilateral paralumbar tenderness. No saddle anesthesia. Neurovascular intact bilateral lower extremities. Good pulses bilaterally   Neurological: He is alert.  Skin: Skin is warm.  Psychiatric: He has a normal mood and affect.  Nursing note and vitals reviewed.    ED Treatments / Results  Labs (all labs ordered are listed, but only abnormal results are displayed) Labs Reviewed - No data to display  EKG EKG Interpretation  Date/Time:  Sunday December 24 2017 19:46:08 EDT Ventricular Rate:  59 PR Interval:  156 QRS Duration: 94 QT Interval:  410 QTC Calculation: 405 R Axis:   3 Text Interpretation:  Sinus bradycardia ST & T wave abnormality, consider inferolateral ischemia Abnormal ECG No significant change since last tracing Confirmed by Richardean Canal [phone]) on 12/24/2017 9:52:39 PM   Radiology No results found.  Procedures Procedures (including critical care time)  Medications Ordered in ED Medications  HYDROmorphone (DILAUDID) injection 1 mg (1 mg Intramuscular Given 12/24/17 2159)  lisinopril (PRINIVIL,ZESTRIL) tablet 10 mg (10 mg Oral Given 12/24/17 2159)  ondansetron (ZOFRAN-ODT) disintegrating tablet 4 mg (4 mg Oral Given 12/24/17 2159)     Initial Impression / Assessment and Plan / ED Course  I have reviewed the triage vital signs and the nursing notes.  Pertinent labs & imaging results that were available during my care of the patient were reviewed by me and considered in my medical decision making (see chart for details).     Alex [name] is a 52 y.o. male here with back pain, hypertension. Neurovascular intact bilateral upper and lower extremities. Has hx of HTN and no pulsatile mass on exam. Has pain management doctor outpatient. Told him that I can give a shot of pain meds but won't be able to prescribe pain meds. He is on small doses of HCTZ/lisinopril and has room for increase if he is  persistently hypertensive so I encouraged him to follow up with PCP.    Final Clinical Impressions(s) / ED Diagnoses   Final diagnoses:  None    ED Discharge Orders    None       Charlynne Pander, MD 12/24/17 2326

## 2017-12-24 NOTE — ED Triage Notes (Signed)
Pt has chronic back pain. States when the pain is bad his BP goes up. States BP 176/105 pta. Pt c/o headache (mha-took imitrex), blurred vision, and nausea.

## 2017-12-24 NOTE — Discharge Instructions (Signed)
Continue your current meds. Your blood pressure is elevated, recheck with your doctor next week   See your pain management doctor  Return to ER if you have worse back pain, chest pain, weakness, numbness

## 2017-12-24 NOTE — ED Notes (Addendum)
Pt requesting "shot of dilaudid," states utilizing the ED is the only way he can manage pain. Reports [facility]," "I'm not addicted to drugs."

## 2017-12-25 ENCOUNTER — Encounter (HOSPITAL_BASED_OUTPATIENT_CLINIC_OR_DEPARTMENT_OTHER): Payer: Self-pay

## 2017-12-25 ENCOUNTER — Emergency Department (HOSPITAL_BASED_OUTPATIENT_CLINIC_OR_DEPARTMENT_OTHER)
Admission: EM | Admit: 2017-12-25 | Discharge: 2017-12-25 | Disposition: A | Discharge status: Home / Self Care | Payer: Medicare Other | Source: Home / Self Care | Attending: Emergency Medicine | Admitting: Emergency Medicine

## 2017-12-25 DIAGNOSIS — I10 Essential (primary) hypertension: Secondary | ICD-10-CM | POA: Insufficient documentation

## 2017-12-25 DIAGNOSIS — R51 Headache: Secondary | ICD-10-CM

## 2017-12-25 DIAGNOSIS — R112 Nausea with vomiting, unspecified: Secondary | ICD-10-CM | POA: Insufficient documentation

## 2017-12-25 DIAGNOSIS — Z79899 Other long term (current) drug therapy: Secondary | ICD-10-CM

## 2017-12-25 LAB — COMPREHENSIVE METABOLIC PANEL WITH GFR
ALT: 16 U/L (ref 0–44)
AST: 19 U/L (ref 15–41)
Albumin: 4.2 g/dL (ref 3.5–5.0)
Alkaline Phosphatase: 54 U/L (ref 38–126)
Anion gap: 11 (ref 5–15)
BUN: 6 mg/dL (ref 6–20)
CO2: 26 mmol/L (ref 22–32)
Calcium: 9 mg/dL (ref 8.9–10.3)
Chloride: 105 mmol/L (ref 98–111)
Creatinine, Ser: 0.87 mg/dL (ref 0.61–1.24)
GFR calc Af Amer: >60 mL/min
GFR calc non Af Amer: >60 mL/min
Glucose, Bld: 125 mg/dL — ABNORMAL HIGH (ref 70–99)
Potassium: 3.6 mmol/L (ref 3.5–5.1)
Sodium: 142 mmol/L (ref 135–145)
Total Bilirubin: 1.1 mg/dL (ref 0.3–1.2)
Total Protein: 7 g/dL (ref 6.5–8.1)

## 2017-12-25 LAB — URINALYSIS, ROUTINE W REFLEX MICROSCOPIC
Bilirubin Urine: NEGATIVE
Glucose, UA: NEGATIVE mg/dL
Hgb urine dipstick: NEGATIVE
Ketones, ur: NEGATIVE mg/dL
Leukocytes, UA: NEGATIVE
Nitrite: NEGATIVE
Protein, ur: NEGATIVE mg/dL
Specific Gravity, Urine: 1.015 (ref 1.005–1.030)
pH: 7 (ref 5.0–8.0)

## 2017-12-25 LAB — CBC WITH DIFFERENTIAL/PLATELET
Basophils Absolute: 0 10*3/uL (ref 0.0–0.1)
Basophils Relative: 0 %
Eosinophils Absolute: 0 10*3/uL (ref 0.0–0.7)
Eosinophils Relative: 1 %
HCT: 41.9 % (ref 39.0–52.0)
Hemoglobin: 14.8 g/dL (ref 13.0–17.0)
Lymphocytes Relative: 29 %
Lymphs Abs: 1.7 10*3/uL (ref 0.7–4.0)
MCH: 30 pg (ref 26.0–34.0)
MCHC: 35.3 g/dL (ref 30.0–36.0)
MCV: 84.8 fL (ref 78.0–100.0)
Monocytes Absolute: 0.4 10*3/uL (ref 0.1–1.0)
Monocytes Relative: 8 %
Neutro Abs: 3.6 10*3/uL (ref 1.7–7.7)
Neutrophils Relative %: 62 %
Platelets: 185 10*3/uL (ref 150–400)
RBC: 4.94 MIL/uL (ref 4.22–5.81)
RDW: 12.6 % (ref 11.5–15.5)
WBC: 5.8 10*3/uL (ref 4.0–10.5)

## 2017-12-25 MED ORDER — PROMETHAZINE HCL 25 MG/ML IJ SOLN
25.0000 mg | Freq: Once | INTRAMUSCULAR | Status: AC
  Administered 2017-12-25: 25 mg via INTRAVENOUS
  Filled 2017-12-25: qty 1

## 2017-12-25 MED ORDER — SODIUM CHLORIDE 0.9 % IV BOLUS
1000.0000 mL | Freq: Once | INTRAVENOUS | Status: AC
  Administered 2017-12-25: 1000 mL via INTRAVENOUS

## 2017-12-25 MED ORDER — KETOROLAC TROMETHAMINE 30 MG/ML IJ SOLN
30.0000 mg | Freq: Once | INTRAMUSCULAR | Status: AC
  Administered 2017-12-25: 30 mg via INTRAVENOUS
  Filled 2017-12-25: qty 1

## 2017-12-25 MED ORDER — HYDROMORPHONE HCL 1 MG/ML IJ SOLN
1.0000 mg | Freq: Once | INTRAMUSCULAR | Status: AC
  Administered 2017-12-25: 1 mg via INTRAVENOUS
  Filled 2017-12-25: qty 1

## 2017-12-25 MED ORDER — ONDANSETRON HCL 4 MG/2ML IJ SOLN: 4.0000 mg | Freq: Once | INTRAMUSCULAR | Status: DC

## 2017-12-25 MED ORDER — ONDANSETRON 4 MG PO TBDP: 4.0000 mg | ORAL_TABLET | Freq: Three times a day (TID) | ORAL | 0 refills | Status: DC | PRN

## 2017-12-25 NOTE — ED Notes (Signed)
Pt received Zofran 4mg  IV en route by EMS

## 2017-12-25 NOTE — ED Notes (Signed)
Abdominal cramping is resolved at this time.

## 2017-12-25 NOTE — ED Notes (Signed)
ED Provider at bedside. 

## 2017-12-25 NOTE — ED Triage Notes (Signed)
Per EMS pt states was here yesterday for chronic back pain and was given dilaudid. States woke up this am with n/v/headache and abdominal cramping

## 2017-12-25 NOTE — ED Provider Notes (Signed)
[facility] EMERGENCY DEPARTMENT Provider Note   CSN: [id number] Arrival date & time: 12/25/17  2979     History   Chief Complaint Chief Complaint  Patient presents with  . Emesis    HPI Alex [name] is a 52 y.o. male.  Pt presents to the ED today with headache and n/v.  The pt is in the process of changing his pain management doctor from [place] to someone else.  He was here yesterday for back pain and received dilaudid for pain.  He said his back pain is better, but now he has a h/a and n/v.   He feels like he is dehydrated.     Past Medical History:  Diagnosis Date  . Chronic back pain   . Hypertension   . Migraine   . PTSD (post-traumatic stress disorder)     Patient Active Problem List   Diagnosis Date Noted  . HEMORRHOIDS-INTERNAL 08/18/2009  . BLOOD IN STOOL-MELENA 08/18/2009  . PERSONAL HX COLONIC POLYPS 08/18/2009  . RECTAL BLEEDING 05/28/2009  . OSTEOARTHRITIS 05/28/2009  . DEPRESSION 05/22/2009  . HYPERTENSION 05/22/2009  . LOW BACK PAIN 05/22/2009  . HEADACHE 05/22/2009  . RECTAL BLEEDING, HX OF 05/22/2009    Past Surgical History:  Procedure Laterality Date  . BACK SURGERY          Home Medications    Prior to Admission medications   Medication Sig Start Date End Date Taking? Authorizing Provider  gabapentin (NEURONTIN) 300 MG capsule Take 1 capsule (300 mg total) by mouth 3 (three) times daily. 10/16/17   Horton, Mayer Masker, MD  hydrochlorothiazide (HYDRODIURIL) 12.5 MG tablet Take 12.5 mg by mouth daily.    [provider]  hydrOXYzine (ATARAX/VISTARIL) 25 MG tablet Take 1 tablet (25 mg total) by mouth every 6 (six) hours as needed for anxiety. 06/27/17   Cristina Gong, PA-C  lisinopril (PRINIVIL,ZESTRIL) 2.5 MG tablet Take 2.5 mg by mouth daily.    [provider]  nabumetone (RELAFEN) 500 MG tablet Take 500 mg by mouth daily.    [provider]  naproxen (NAPROSYN) 500 MG tablet Take 1 tablet (500 mg  total) by mouth 2 (two) times daily. 10/16/17   Horton, Mayer Masker, MD  ondansetron (ZOFRAN ODT) 4 MG disintegrating tablet Take 1 tablet (4 mg total) by mouth every 8 (eight) hours as needed. 12/25/17   Jacalyn Lefevre, MD  ondansetron (ZOFRAN) 4 MG tablet Take 1 tablet (4 mg total) by mouth every 8 (eight) hours as needed for nausea or vomiting. 06/26/17   Pisciotta, Joni Reining, PA-C  venlafaxine (EFFEXOR) 100 MG tablet Take 250 mg by mouth once.    [provider]    Family History No family history on file.  Social History Social History   Tobacco Use  . Smoking status: Never Smoker  . Smokeless tobacco: Never Used  Substance Use Topics  . Alcohol use: Yes    Comment: occ  . Drug use: No     Allergies   Trazodone and nefazodone   Review of Systems Review of Systems  Gastrointestinal: Positive for nausea and vomiting.  Neurological: Positive for headaches.  All other systems reviewed and are negative.    Physical Exam Updated Vital Signs BP (!) 140/101 (BP Location: Left Arm)   Pulse (!) 58   Temp 98.7 F (37.1 C) (Oral)   Resp 16   SpO2 99%   Physical Exam  Constitutional: He is oriented to person, place, and time. He appears  well-developed and well-nourished.  HENT:  Head: Normocephalic and atraumatic.  Right Ear: External ear normal.  Left Ear: External ear normal.  Nose: Nose normal.  Mouth/Throat: Oropharynx is clear and moist.  Eyes: Pupils are equal, round, and reactive to light. Conjunctivae and EOM are normal.  Neck: Normal range of motion. Neck supple.  Cardiovascular: Normal rate, regular rhythm, normal heart sounds and intact distal pulses.  Pulmonary/Chest: Effort normal and breath sounds normal.  Abdominal: Soft. Bowel sounds are normal.  Musculoskeletal: Normal range of motion.  Neurological: He is alert and oriented to person, place, and time.  Skin: Skin is warm. Capillary refill takes less than 2 seconds.  Psychiatric: He has a normal  mood and affect. His behavior is normal. Judgment and thought content normal.  Nursing note and vitals reviewed.    ED Treatments / Results  Labs (all labs ordered are listed, but only abnormal results are displayed) Labs Reviewed  COMPREHENSIVE METABOLIC PANEL - Abnormal; Notable for the following components:      Result Value   Glucose, Bld 125 (*)    All other components within normal limits  CBC WITH DIFFERENTIAL/PLATELET  URINALYSIS, ROUTINE W REFLEX MICROSCOPIC    EKG None  Radiology No results found.  Procedures Procedures (including critical care time)  Medications Ordered in ED Medications  ondansetron (ZOFRAN) injection 4 mg (4 mg Intravenous Not Given 12/25/17 0918)  sodium chloride 0.9 % bolus 1,000 mL (0 mLs Intravenous Stopped 12/25/17 1025)  HYDROmorphone (DILAUDID) injection 1 mg (1 mg Intravenous Given 12/25/17 0918)  promethazine (PHENERGAN) injection 25 mg (25 mg Intravenous Given 12/25/17 1139)  ketorolac (TORADOL) 30 MG/ML injection 30 mg (30 mg Intravenous Given 12/25/17 1140)     Initial Impression / Assessment and Plan / ED Course  I have reviewed the triage vital signs and the nursing notes.  Pertinent labs & imaging results that were available during my care of the patient were reviewed by me and considered in my medical decision making (see chart for details).    Pt has improved with treatment.  Labs ok.  Pt is stable for d/c.  Return if worse.  Final Clinical Impressions(s) / ED Diagnoses   Final diagnoses:  Non-intractable vomiting with nausea, unspecified vomiting type    ED Discharge Orders         Ordered    ondansetron (ZOFRAN ODT) 4 MG disintegrating tablet  Every 8 hours PRN     12/25/17 1152           Jacalyn Lefevre, MD 12/25/17 1152

## 2018-01-12 ENCOUNTER — Emergency Department (HOSPITAL_BASED_OUTPATIENT_CLINIC_OR_DEPARTMENT_OTHER)
Admission: EM | Admit: 2018-01-12 | Discharge: 2018-01-12 | Disposition: A | Discharge status: Home / Self Care | Payer: Medicare Other | Attending: Emergency Medicine | Admitting: Emergency Medicine

## 2018-01-12 ENCOUNTER — Other Ambulatory Visit: Payer: Self-pay

## 2018-01-12 ENCOUNTER — Encounter (HOSPITAL_BASED_OUTPATIENT_CLINIC_OR_DEPARTMENT_OTHER): Payer: Self-pay | Admitting: Emergency Medicine

## 2018-01-12 DIAGNOSIS — G8929 Other chronic pain: Secondary | ICD-10-CM | POA: Diagnosis not present

## 2018-01-12 DIAGNOSIS — I10 Essential (primary) hypertension: Secondary | ICD-10-CM | POA: Diagnosis not present

## 2018-01-12 DIAGNOSIS — Z79899 Other long term (current) drug therapy: Secondary | ICD-10-CM | POA: Insufficient documentation

## 2018-01-12 DIAGNOSIS — M545 Low back pain, unspecified: Secondary | ICD-10-CM

## 2018-01-12 MED ORDER — ONDANSETRON 4 MG PO TBDP
4.0000 mg | ORAL_TABLET | Freq: Once | ORAL | Status: AC
  Administered 2018-01-12: 4 mg via ORAL
  Filled 2018-01-12: qty 1

## 2018-01-12 MED ORDER — HYDROMORPHONE HCL 1 MG/ML IJ SOLN
2.0000 mg | Freq: Once | INTRAMUSCULAR | Status: AC
  Administered 2018-01-12: 2 mg via INTRAMUSCULAR
  Filled 2018-01-12: qty 2

## 2018-01-12 MED ORDER — KETOROLAC TROMETHAMINE 60 MG/2ML IM SOLN
60.0000 mg | Freq: Once | INTRAMUSCULAR | Status: AC
  Administered 2018-01-12: 60 mg via INTRAMUSCULAR
  Filled 2018-01-12: qty 2

## 2018-01-12 NOTE — ED Provider Notes (Signed)
[facility] EMERGENCY DEPARTMENT Provider Note   CSN: [id number] Arrival date & time: 01/12/18  1324     History   Chief Complaint Chief Complaint  Patient presents with  . Back Pain    HPI Alex [name] is a 52 y.o. male.  Patient is a 52 year old male with a history of chronic back pain and hypertension presenting today with worsening back pain after having EMGs done yesterday.  He states he takes Motrin at home for the pain and that is currently his pain management regime.  He does not take narcotics at home and sees the [place] for this problem.  He states usually when the pain flares he comes and gets a shot of Dilaudid and sometimes Toradol and that seems to bring his pain back to its manageable level of 4 out of 10.  He states he just thinks the EMGs yesterday irritated everything.  He is getting intermittent spasm but does not take medication for spasm.  He has no bowel or bladder complaints today.  The history is provided by the patient.  Back Pain   This is a chronic problem. The current episode started yesterday. The problem occurs constantly. The problem has not changed since onset.The pain is associated with no known injury. The pain is present in the lumbar spine. The quality of the pain is described as stabbing and shooting. The pain does not radiate. The pain is at a severity of 8/10. The pain is severe. The symptoms are aggravated by certain positions, twisting and bending. The pain is the same all the time. Stiffness is present in the morning. Pertinent negatives include no fever, no numbness, no abdominal pain, no bowel incontinence, no bladder incontinence, no dysuria, no leg pain, no paresthesias and no weakness. He has tried NSAIDs for the symptoms. The treatment provided no relief.    Past Medical History:  Diagnosis Date  . Chronic back pain   . Hypertension   . Migraine   . PTSD (post-traumatic stress disorder)     Patient Active Problem List   Diagnosis  Date Noted  . HEMORRHOIDS-INTERNAL 08/18/2009  . BLOOD IN STOOL-MELENA 08/18/2009  . PERSONAL HX COLONIC POLYPS 08/18/2009  . RECTAL BLEEDING 05/28/2009  . OSTEOARTHRITIS 05/28/2009  . DEPRESSION 05/22/2009  . HYPERTENSION 05/22/2009  . LOW BACK PAIN 05/22/2009  . HEADACHE 05/22/2009  . RECTAL BLEEDING, HX OF 05/22/2009    Past Surgical History:  Procedure Laterality Date  . BACK SURGERY          Home Medications    Prior to Admission medications   Medication Sig Start Date End Date Taking? Authorizing Provider  ibuprofen (ADVIL,MOTRIN) 800 MG tablet Take by mouth. 05/15/14  Yes [provider]  hydrochlorothiazide (HYDRODIURIL) 12.5 MG tablet Take 12.5 mg by mouth daily.    [provider]  lisinopril (PRINIVIL,ZESTRIL) 2.5 MG tablet Take 2.5 mg by mouth daily.    [provider]  nabumetone (RELAFEN) 500 MG tablet Take 500 mg by mouth daily.    [provider]  venlafaxine (EFFEXOR) 100 MG tablet Take 250 mg by mouth once.    [provider]    Family History No family history on file.  Social History Social History   Tobacco Use  . Smoking status: Never Smoker  . Smokeless tobacco: Never Used  Substance Use Topics  . Alcohol use: Yes    Comment: occ  . Drug use: No     Allergies   Trazodone and nefazodone  Review of Systems Review of Systems  Constitutional: Negative for fever.  Gastrointestinal: Negative for abdominal pain and bowel incontinence.  Genitourinary: Negative for bladder incontinence and dysuria.  Musculoskeletal: Positive for back pain.  Neurological: Negative for weakness, numbness and paresthesias.  All other systems reviewed and are negative.    Physical Exam Updated Vital Signs BP (!) 167/107 (BP Location: Left Arm)   Pulse (!) 54   Temp 98.4 F (36.9 C) (Oral)   Resp 18   Ht 5\' 7"  (1.702 m)   Wt 81.6 kg   SpO2 100%   BMI 28.19 kg/m   Physical Exam  Constitutional: He is  oriented to person, place, and time. He appears well-developed and well-nourished. No distress.  HENT:  Head: Normocephalic and atraumatic.  Mouth/Throat: Oropharynx is clear and moist.  Eyes: Pupils are equal, round, and reactive to light. EOM are normal.  Neck: Normal range of motion. Neck supple. No spinous process tenderness and no muscular tenderness present. No neck rigidity. Normal range of motion present.  Cardiovascular: Normal rate, regular rhythm, normal heart sounds and intact distal pulses.  No murmur heard. Pulmonary/Chest: Effort normal and breath sounds normal. He has no wheezes. He has no rales.  Abdominal: Soft. He exhibits no distension. There is no tenderness. There is no CVA tenderness.  Musculoskeletal: He exhibits tenderness.       Lumbar back: He exhibits decreased range of motion and pain. He exhibits no swelling, no deformity and normal pulse.       Back:  Neurological: He is alert and oriented to person, place, and time. He has normal strength. No sensory deficit. Coordination normal.  Skin: Skin is warm and dry. No rash noted.  Psychiatric: He has a normal mood and affect.  Nursing note and vitals reviewed.    ED Treatments / Results  Labs (all labs ordered are listed, but only abnormal results are displayed) Labs Reviewed - No data to display  EKG None  Radiology No results found.  Procedures Procedures (including critical care time)  Medications Ordered in ED Medications  HYDROmorphone (DILAUDID) injection 2 mg (has no administration in time range)  ketorolac (TORADOL) injection 60 mg (has no administration in time range)  ondansetron (ZOFRAN-ODT) disintegrating tablet 4 mg (has no administration in time range)     Initial Impression / Assessment and Plan / ED Course  I have reviewed the triage vital signs and the nursing notes.  Pertinent labs & imaging results that were available during my care of the patient were reviewed by me and  considered in my medical decision making (see chart for details).     Pt with gradual onset of back pain suggestive of MSK with hx of chronic back pain and had EMGs done yesterday in preparation for surgery.  No neurovascular compromise and no incontinence.  Pt has no infectious sx, hx of CA  or other red flags concerning for pathologic back pain.  Pt is able to ambulate but is painful.  Normal strength on exam.  Denies trauma. Will give pt pain control and to return for developement of above sx.   Final Clinical Impressions(s) / ED Diagnoses   Final diagnoses:  Chronic left-sided low back pain without sciatica    ED Discharge Orders    None       Gwyneth Sprout, MD 01/12/18 [phone]

## 2018-01-12 NOTE — ED Triage Notes (Signed)
Back pain since 1988.  Has back surgery planned.  Had EMG done yesterday and it "aggravated it".  Pt walks very slowly.  Sts "All I need is a shot of dilaudid and I'm outa here.".

## 2018-01-12 NOTE — ED Notes (Signed)
Pt refused wheelchair. And states just took icepack off back.

## 2018-03-11 ENCOUNTER — Emergency Department (HOSPITAL_BASED_OUTPATIENT_CLINIC_OR_DEPARTMENT_OTHER): Payer: Medicare Other

## 2018-03-11 ENCOUNTER — Encounter (HOSPITAL_BASED_OUTPATIENT_CLINIC_OR_DEPARTMENT_OTHER): Payer: Self-pay | Admitting: Emergency Medicine

## 2018-03-11 ENCOUNTER — Other Ambulatory Visit: Payer: Self-pay

## 2018-03-11 ENCOUNTER — Emergency Department (HOSPITAL_BASED_OUTPATIENT_CLINIC_OR_DEPARTMENT_OTHER)
Admission: EM | Admit: 2018-03-11 | Discharge: 2018-03-11 | Disposition: A | Discharge status: Home / Self Care | Payer: Medicare Other | Attending: Emergency Medicine | Admitting: Emergency Medicine

## 2018-03-11 DIAGNOSIS — I10 Essential (primary) hypertension: Secondary | ICD-10-CM | POA: Diagnosis not present

## 2018-03-11 DIAGNOSIS — M545 Low back pain: Secondary | ICD-10-CM | POA: Diagnosis present

## 2018-03-11 DIAGNOSIS — G8918 Other acute postprocedural pain: Secondary | ICD-10-CM

## 2018-03-11 DIAGNOSIS — Z79899 Other long term (current) drug therapy: Secondary | ICD-10-CM | POA: Diagnosis not present

## 2018-03-11 LAB — CBC WITH DIFFERENTIAL/PLATELET
Abs Immature Granulocytes: 0.05 10*3/uL (ref 0.00–0.07)
Basophils Absolute: 0 10*3/uL (ref 0.0–0.1)
Basophils Relative: 0 %
Eosinophils Absolute: 0.2 10*3/uL (ref 0.0–0.5)
Eosinophils Relative: 4 %
HCT: 41.8 % (ref 39.0–52.0)
Hemoglobin: 14.2 g/dL (ref 13.0–17.0)
Immature Granulocytes: 1 %
Lymphocytes Relative: 36 %
Lymphs Abs: 2.4 10*3/uL (ref 0.7–4.0)
MCH: 29.9 pg (ref 26.0–34.0)
MCHC: 34 g/dL (ref 30.0–36.0)
MCV: 88 fL (ref 80.0–100.0)
Monocytes Absolute: 0.7 10*3/uL (ref 0.1–1.0)
Monocytes Relative: 10 %
Neutro Abs: 3.3 10*3/uL (ref 1.7–7.7)
Neutrophils Relative %: 49 %
Platelets: 219 10*3/uL (ref 150–400)
RBC: 4.75 MIL/uL (ref 4.22–5.81)
RDW: 12.6 % (ref 11.5–15.5)
WBC: 6.7 10*3/uL (ref 4.0–10.5)
nRBC: 0 % (ref 0.0–0.2)

## 2018-03-11 LAB — BASIC METABOLIC PANEL
Anion gap: 9 (ref 5–15)
BUN: 11 mg/dL (ref 6–20)
CO2: 23 mmol/L (ref 22–32)
Calcium: 9 mg/dL (ref 8.9–10.3)
Chloride: 103 mmol/L (ref 98–111)
Creatinine, Ser: 1.06 mg/dL (ref 0.61–1.24)
GFR calc Af Amer: >60 mL/min (ref >60)
GFR calc non Af Amer: >60 mL/min (ref >60)
Glucose, Bld: 108 mg/dL — ABNORMAL HIGH (ref 70–99)
Potassium: 3.5 mmol/L (ref 3.5–5.1)
Sodium: 135 mmol/L (ref 135–145)

## 2018-03-11 MED ORDER — IOPAMIDOL (ISOVUE-300) INJECTION 61%
100.0000 mL | Freq: Once | INTRAVENOUS | Status: AC
  Administered 2018-03-11: 100 mL via INTRAVENOUS

## 2018-03-11 MED ORDER — HYDROMORPHONE HCL 1 MG/ML IJ SOLN
1.0000 mg | Freq: Once | INTRAMUSCULAR | Status: AC
  Administered 2018-03-11: 1 mg via INTRAVENOUS
  Filled 2018-03-11: qty 1

## 2018-03-11 MED ORDER — HYDROMORPHONE HCL 2 MG PO TABS: 2.0000 mg | ORAL_TABLET | ORAL | 0 refills | Status: DC | PRN

## 2018-03-11 MED ORDER — ONDANSETRON HCL 4 MG/2ML IJ SOLN
4.0000 mg | Freq: Once | INTRAMUSCULAR | Status: AC
  Administered 2018-03-11: 4 mg via INTRAVENOUS
  Filled 2018-03-11: qty 2

## 2018-03-11 NOTE — ED Provider Notes (Signed)
[facility] Provider Note: Alex Dell, MD, FACEP  CSN: [id number] MRN: [id number] ARRIVAL: 03/11/18 at 0427 ROOM: MH09/MH09   CHIEF COMPLAINT  Back Pain   HISTORY OF PRESENT ILLNESS  03/11/18 4:40 AM Alex [name] is a 52 y.o. male who underwent an L3-4 lumbar laminectomy at [facility] on the 11th of this month.  He is experiencing a sudden increase in pain that began yesterday evening about 6 PM.  It is sharp and electric-like and is severe.  It begins in his anterior thigh and moves to the right posterior calf in a pattern wider than a single dermatome.  Pain is exacerbated with movement of the lower back or the right hip.  He is having no new weakness or numbness.  He denies bowel or bladder changes.  He has had no fever.  His incision site is healing well without local erythema, warmth or tenderness.  He denies new injury.  He took his prescribed hydrocodone without adequate relief.   Past Medical History:  Diagnosis Date  . Chronic back pain   . Hypertension   . Migraine   . PTSD (post-traumatic stress disorder)     Past Surgical History:  Procedure Laterality Date  . BACK SURGERY      No family history on file.  Social History   Tobacco Use  . Smoking status: Never Smoker  . Smokeless tobacco: Never Used  Substance Use Topics  . Alcohol use: Yes    Comment: occ  . Drug use: No    Prior to Admission medications   Medication Sig Start Date End Date Taking? Authorizing Provider  hydrochlorothiazide (HYDRODIURIL) 12.5 MG tablet Take 12.5 mg by mouth daily.    [provider]  ibuprofen (ADVIL,MOTRIN) 800 MG tablet Take by mouth. 05/15/14   [provider]  lisinopril (PRINIVIL,ZESTRIL) 2.5 MG tablet Take 2.5 mg by mouth daily.    [provider]  nabumetone (RELAFEN) 500 MG tablet Take 500 mg by mouth daily.    [provider]  venlafaxine (EFFEXOR) 100 MG tablet Take 250 mg by mouth once.    [provider]    Allergies Trazodone and nefazodone   REVIEW OF SYSTEMS  Negative except as noted here or in the History of Present Illness.   PHYSICAL EXAMINATION  Initial Vital Signs Blood pressure 133/89, pulse 66, temperature 97.6 F (36.4 C), temperature source Oral, resp. rate 20, height 5\' 8"  (1.727 m), weight 81.6 kg, SpO2 98 %.  Examination General: Well-developed, well-nourished male in no acute distress; appearance consistent with age of record HENT: normocephalic; atraumatic Eyes: pupils equal, round and reactive to light; extraocular muscles intact Neck: supple Heart: regular rate and rhythm Lungs: clear to auscultation bilaterally Abdomen: soft; nondistended; nontender; bowel sounds present Back: Well-healing midline lumbar surgical incision without erythema, warmth or tenderness; positive straight leg raise on the right at about 15 degrees; pain on movement of lower back Extremities: No deformity; full range of motion; pulses normal Neurologic: Awake, alert and oriented; motor function intact in all extremities and symmetric; sensation intact in the lower extremities and symmetric; no saddle anesthesia; no facial droop Skin: Warm and dry Psychiatric: Grimacing   RESULTS  Summary of this visit's results, reviewed by myself:   EKG Interpretation  Date/Time:    Ventricular Rate:    PR Interval:    QRS Duration:   QT Interval:    QTC Calculation:   R Axis:     Text Interpretation:  Laboratory Studies: Results for orders placed or performed during the hospital encounter of 03/11/18 (from the past 24 hour(s))  CBC with Differential/Platelet     Status: None   Collection Time: 03/11/18  4:51 AM  Result Value Ref Range   WBC 6.7 4.0 - 10.5 K/uL   RBC 4.75 4.22 - 5.81 MIL/uL   Hemoglobin 14.2 13.0 - 17.0 g/dL   HCT 16.1 09.6 - 04.5 %   MCV 88.0 80.0 - 100.0 fL   MCH 29.9 26.0 - 34.0 pg   MCHC 34.0 30.0 - 36.0 g/dL   RDW 40.9 81.1 - 91.4 %    Platelets 219 150 - 400 K/uL   nRBC 0.0 0.0 - 0.2 %   Neutrophils Relative % 49 %   Neutro Abs 3.3 1.7 - 7.7 K/uL   Lymphocytes Relative 36 %   Lymphs Abs 2.4 0.7 - 4.0 K/uL   Monocytes Relative 10 %   Monocytes Absolute 0.7 0.1 - 1.0 K/uL   Eosinophils Relative 4 %   Eosinophils Absolute 0.2 0.0 - 0.5 K/uL   Basophils Relative 0 %   Basophils Absolute 0.0 0.0 - 0.1 K/uL   Immature Granulocytes 1 %   Abs Immature Granulocytes 0.05 0.00 - 0.07 K/uL  Basic metabolic panel     Status: Abnormal   Collection Time: 03/11/18  4:51 AM  Result Value Ref Range   Sodium 135 135 - 145 mmol/L   Potassium 3.5 3.5 - 5.1 mmol/L   Chloride 103 98 - 111 mmol/L   CO2 23 22 - 32 mmol/L   Glucose, Bld 108 (H) 70 - 99 mg/dL   BUN 11 6 - 20 mg/dL   Creatinine, Ser 7.82 0.61 - 1.24 mg/dL   Calcium 9.0 8.9 - 95.6 mg/dL   GFR calc non Af Amer >60 >60 mL/min   GFR calc Af Amer >60 >60 mL/min   Anion gap 9 5 - 15   Imaging Studies: Ct Lumbar Spine W Contrast  Result Date: 03/11/2018 CLINICAL DATA:  Back pain radiating to the right. Recent back surgery a week for S November 11th. EXAM: CT LUMBAR SPINE WITHOUT CONTRAST TECHNIQUE: Multidetector CT imaging of the lumbar spine was performed without intravenous contrast administration. Multiplanar CT image reconstructions were also generated. COMPARISON:  Lumbar spine MRI report [facility] 12/29/2017 FINDINGS: Segmentation: 5 lumbar type vertebral bodies Alignment: Straightening without subluxation Vertebrae: No acute fracture or focal pathologic process. Paraspinal and other soft tissues: No evidence of collection. There is expected soft tissue edema in the operative tract. Disc levels: T12- L1: Unremarkable. L1-L2: Unremarkable. L2-L3: Unremarkable. L3-L4: Disc narrowing and bulging. Laminotomy without gross compressive stenosis or high-density collection. L4-L5: Disc narrowing and bulging. Spinal stenosis that is likely mild. L5-S1:Advanced disc narrowing with  endplate ridging. Mild facet spurring. Right more than left foraminal stenosis and impingement. No evidence of canal impingement. IMPRESSION: 1. Recent L3-4 laminotomy with no evident complication or impingement. 2. L5-S1 advanced disc degeneration with right more than left foraminal stenosis. 3. No acute finding. Electronically Signed   By: Marnee Spring M.D.   On: 03/11/2018 06:37    ED COURSE and MDM  Nursing notes and initial vitals signs, including pulse oximetry, reviewed.  Vitals:   03/11/18 0437  BP: 133/89  Pulse: 66  Resp: 20  Temp: 97.6 F (36.4 C)  TempSrc: Oral  SpO2: 98%  Weight: 81.6 kg  Height: 5\' 8"  (1.727 m)   6:53 AM Patient advised of reassuring CT scan.  We will provide him a short course of stronger analgesic until he can [facility].  Cons[place]ation with [place] [facility] [place] substances [place] reveals the patient has received 2 opioid prescriptions in the past 2 years, most recently on the day of his surgery.  PROCEDURES    ED DIAGNOSES     ICD-10-CM   1. Postoperative pain after spinal surgery G89.18        Chloee Tena, Jonny RuizJohn, MD 03/11/18 [id number]

## 2018-03-11 NOTE — ED Triage Notes (Signed)
Pt had a [facility] the 11[facility]. Yesterday apporx 1800 increase in back pain that is radiating down right leg/thigh and around belt line. Denies B/B incont. States took a hydrocodone around 1800 yesterday

## 2018-03-14 ENCOUNTER — Other Ambulatory Visit: Payer: Self-pay

## 2018-03-14 ENCOUNTER — Emergency Department (HOSPITAL_BASED_OUTPATIENT_CLINIC_OR_DEPARTMENT_OTHER)
Admission: EM | Admit: 2018-03-14 | Discharge: 2018-03-14 | Disposition: A | Discharge status: Home / Self Care | Payer: Medicare Other | Attending: Emergency Medicine | Admitting: Emergency Medicine

## 2018-03-14 ENCOUNTER — Encounter (HOSPITAL_BASED_OUTPATIENT_CLINIC_OR_DEPARTMENT_OTHER): Payer: Self-pay | Admitting: Emergency Medicine

## 2018-03-14 DIAGNOSIS — I10 Essential (primary) hypertension: Secondary | ICD-10-CM | POA: Insufficient documentation

## 2018-03-14 DIAGNOSIS — G8918 Other acute postprocedural pain: Secondary | ICD-10-CM | POA: Diagnosis not present

## 2018-03-14 DIAGNOSIS — M5416 Radiculopathy, lumbar region: Secondary | ICD-10-CM | POA: Diagnosis not present

## 2018-03-14 DIAGNOSIS — Z79899 Other long term (current) drug therapy: Secondary | ICD-10-CM | POA: Diagnosis not present

## 2018-03-14 DIAGNOSIS — M545 Low back pain: Secondary | ICD-10-CM | POA: Diagnosis present

## 2018-03-14 MED ORDER — DEXAMETHASONE SODIUM PHOSPHATE 10 MG/ML IJ SOLN
10.0000 mg | Freq: Once | INTRAMUSCULAR | Status: AC
  Administered 2018-03-14: 10 mg via INTRAVENOUS
  Filled 2018-03-14: qty 1

## 2018-03-14 MED ORDER — METHOCARBAMOL 500 MG PO TABS: 500.0000 mg | ORAL_TABLET | Freq: Three times a day (TID) | ORAL | 0 refills | Status: DC | PRN

## 2018-03-14 MED ORDER — GABAPENTIN 300 MG PO CAPS
ORAL_CAPSULE | ORAL | Status: AC
  Administered 2018-03-14: 300 mg
  Filled 2018-03-14: qty 1

## 2018-03-14 MED ORDER — METHYLPREDNISOLONE 4 MG PO TBPK: ORAL_TABLET | ORAL | 0 refills | Status: DC

## 2018-03-14 MED ORDER — KETOROLAC TROMETHAMINE 30 MG/ML IJ SOLN
30.0000 mg | Freq: Once | INTRAMUSCULAR | Status: AC
  Administered 2018-03-14: 30 mg via INTRAVENOUS
  Filled 2018-03-14: qty 1

## 2018-03-14 MED ORDER — GABAPENTIN 600 MG PO TABS
300.0000 mg | ORAL_TABLET | Freq: Once | ORAL | Status: DC
  Filled 2018-03-14: qty 0.5

## 2018-03-14 MED ORDER — GABAPENTIN 300 MG PO CAPS: 300.0000 mg | ORAL_CAPSULE | Freq: Three times a day (TID) | ORAL | 0 refills | Status: DC

## 2018-03-14 MED ORDER — HYDROMORPHONE HCL 1 MG/ML IJ SOLN
1.0000 mg | Freq: Once | INTRAMUSCULAR | Status: AC
  Administered 2018-03-14: 1 mg via INTRAVENOUS
  Filled 2018-03-14: qty 1

## 2018-03-14 NOTE — Discharge Instructions (Addendum)
Follow up with the neurosurgeon as planned °

## 2018-03-14 NOTE — ED Provider Notes (Signed)
[facility] EMERGENCY DEPARTMENT Provider Note   CSN: [id number] Arrival date & time: 03/14/18  0913     History   Chief Complaint Chief Complaint  Patient presents with  . Back Pain    HPI Alex [name] is a 52 y.o. male.  HPI Patient presents with back pain.  Around 16 days ago had laminectomy at [facility] by Dr. Andrey Campanile.  Had been doing well for 3 or 4 days but then increased pain.  Had discussed with the surgeons and they added steroids and Neurontin but he was not able to get them filled.  Continued pain.  States pain is so hard today that it knocked him down.  No weakness.  No fevers.  Got seen in the ER around 4 days ago for this and had a negative CT scan at that time.  No loss of bladder or bowel control.  Pain radiates down his right leg, which is the pain he was having prior to the surgery also.  Took hydrocodone today without relief. Past Medical History:  Diagnosis Date  . Chronic back pain   . Hypertension   . Migraine   . PTSD (post-traumatic stress disorder)     Patient Active Problem List   Diagnosis Date Noted  . HEMORRHOIDS-INTERNAL 08/18/2009  . BLOOD IN STOOL-MELENA 08/18/2009  . PERSONAL HX COLONIC POLYPS 08/18/2009  . RECTAL BLEEDING 05/28/2009  . OSTEOARTHRITIS 05/28/2009  . DEPRESSION 05/22/2009  . HYPERTENSION 05/22/2009  . LOW BACK PAIN 05/22/2009  . HEADACHE 05/22/2009  . RECTAL BLEEDING, HX OF 05/22/2009    Past Surgical History:  Procedure Laterality Date  . BACK SURGERY          Home Medications    Prior to Admission medications   Medication Sig Start Date End Date Taking? Authorizing Provider  ibuprofen (ADVIL,MOTRIN) 800 MG tablet Take by mouth. 05/15/14  Yes [provider]  gabapentin (NEURONTIN) 300 MG capsule Take 1 capsule (300 mg total) by mouth 3 (three) times daily. 03/14/18   Benjiman Core, MD  hydrochlorothiazide (HYDRODIURIL) 12.5 MG tablet Take 12.5 mg by mouth daily.     [provider]  lisinopril (PRINIVIL,ZESTRIL) 2.5 MG tablet Take 2.5 mg by mouth daily.    [provider]  methocarbamol (ROBAXIN) 500 MG tablet Take 1 tablet (500 mg total) by mouth every 8 (eight) hours as needed for muscle spasms. 03/14/18   Benjiman Core, MD  methylPREDNISolone (MEDROL DOSEPAK) 4 MG TBPK tablet Use per box 6-day taper instructions per 03/14/18   Benjiman Core, MD  nabumetone (RELAFEN) 500 MG tablet Take by mouth.    [provider]  venlafaxine (EFFEXOR) 100 MG tablet Take 250 mg by mouth once.    [provider]    Family History No family history on file.  Social History Social History   Tobacco Use  . Smoking status: Never Smoker  . Smokeless tobacco: Never Used  Substance Use Topics  . Alcohol use: Yes    Comment: occ  . Drug use: No     Allergies   Trazodone and nefazodone   Review of Systems Review of Systems  Constitutional: Negative for fever.  Cardiovascular: Negative for chest pain.  Gastrointestinal: Negative for abdominal pain.  Genitourinary: Negative for flank pain.  Musculoskeletal: Positive for back pain.  Skin: Negative for rash.  Neurological: Negative for weakness and numbness.     Physical Exam Updated Vital Signs BP (!) 147/103 (BP Location: Right Arm)  Pulse (!) 59   Resp 16   Ht 5\' 7"  (1.702 m)   Wt 81.6 kg   SpO2 97%   BMI 28.19 kg/m   Physical Exam  Constitutional: He appears well-developed.  HENT:  Head: Normocephalic.  Neck: Neck supple.  Cardiovascular: Normal rate.  Pulmonary/Chest: He exhibits no tenderness.  Abdominal: There is no tenderness.  Musculoskeletal:  Well-healing wound over lumbar spine.  Some pain with movement of right leg.  Good flexion and extension at the ankle and knee.  Sensation intact over foot.  Neurological: He is alert.  Skin: Skin is warm. Capillary refill takes less than 2 seconds.     ED Treatments / Results  Labs (all labs  ordered are listed, but only abnormal results are displayed) Labs Reviewed - No data to display  EKG None  Radiology No results found.  Procedures Procedures (including critical care time)  Medications Ordered in ED Medications  gabapentin (NEURONTIN) tablet 300 mg (300 mg Oral Not Given 03/14/18 0959)  ketorolac (TORADOL) 30 MG/ML injection 30 mg (30 mg Intravenous Given 03/14/18 0953)  HYDROmorphone (DILAUDID) injection 1 mg (1 mg Intravenous Given 03/14/18 0953)  dexamethasone (DECADRON) injection 10 mg (10 mg Intravenous Given 03/14/18 0954)  gabapentin (NEURONTIN) 300 MG capsule (300 mg  Given 03/14/18 [id number])     Initial Impression / Assessment and Plan / ED Course  I have reviewed the triage vital signs and the nursing notes.  Pertinent labs & imaging results that were available during my care of the patient were reviewed by me and considered in my medical decision making (see chart for details).     Patient presents with back pain.  Surgery around 16 days ago.  Has had pain somewhat since.  CT done 3 days ago.  Discussed with the patient's surgeon, Dr. Andrey CampanileWilson.  Will give steroids and muscle relaxers.  Given prescription for Neurontin.  [place]  Discharge home and will follow-up as an outpatient  Final Clinical Impressions(s) / ED Diagnoses   Final diagnoses:  Lumbar radiculopathy    ED Discharge Orders         Ordered    methocarbamol (ROBAXIN) 500 MG tablet  Every 8 hours PRN     03/14/18 1109    methylPREDNISolone (MEDROL DOSEPAK) 4 MG TBPK tablet     03/14/18 1109    gabapentin (NEURONTIN) 300 MG capsule  3 times daily     03/14/18 1109           Benjiman CorePickering, Dakhari Zuver, MD 03/14/18 1139

## 2018-03-14 NOTE — ED Triage Notes (Signed)
[facility]. Seen here 3 days ago for pain.  Yesterday pain increased again and then this morning he fell forward onto knees.  EMS found him in that position. Has had 150mg  Fentanyl enroute.

## 2018-05-20 ENCOUNTER — Emergency Department (HOSPITAL_BASED_OUTPATIENT_CLINIC_OR_DEPARTMENT_OTHER)
Admission: EM | Admit: 2018-05-20 | Discharge: 2018-05-20 | Disposition: A | Discharge status: Home / Self Care | Payer: Medicare Other | Attending: Emergency Medicine | Admitting: Emergency Medicine

## 2018-05-20 ENCOUNTER — Encounter (HOSPITAL_BASED_OUTPATIENT_CLINIC_OR_DEPARTMENT_OTHER): Payer: Self-pay | Admitting: Emergency Medicine

## 2018-05-20 ENCOUNTER — Other Ambulatory Visit: Payer: Self-pay

## 2018-05-20 ENCOUNTER — Emergency Department (HOSPITAL_BASED_OUTPATIENT_CLINIC_OR_DEPARTMENT_OTHER): Payer: Medicare Other

## 2018-05-20 DIAGNOSIS — R69 Illness, unspecified: Secondary | ICD-10-CM

## 2018-05-20 DIAGNOSIS — Y939 Activity, unspecified: Secondary | ICD-10-CM | POA: Insufficient documentation

## 2018-05-20 DIAGNOSIS — S39012A Strain of muscle, fascia and tendon of lower back, initial encounter: Secondary | ICD-10-CM | POA: Insufficient documentation

## 2018-05-20 DIAGNOSIS — I10 Essential (primary) hypertension: Secondary | ICD-10-CM | POA: Diagnosis not present

## 2018-05-20 DIAGNOSIS — X58XXXA Exposure to other specified factors, initial encounter: Secondary | ICD-10-CM | POA: Insufficient documentation

## 2018-05-20 DIAGNOSIS — J111 Influenza due to unidentified influenza virus with other respiratory manifestations: Secondary | ICD-10-CM | POA: Diagnosis not present

## 2018-05-20 DIAGNOSIS — Y999 Unspecified external cause status: Secondary | ICD-10-CM | POA: Diagnosis not present

## 2018-05-20 DIAGNOSIS — S3992XA Unspecified injury of lower back, initial encounter: Secondary | ICD-10-CM | POA: Diagnosis present

## 2018-05-20 DIAGNOSIS — Z79899 Other long term (current) drug therapy: Secondary | ICD-10-CM | POA: Diagnosis not present

## 2018-05-20 DIAGNOSIS — Y929 Unspecified place or not applicable: Secondary | ICD-10-CM | POA: Insufficient documentation

## 2018-05-20 MED ORDER — KETOROLAC TROMETHAMINE 30 MG/ML IJ SOLN
30.0000 mg | Freq: Once | INTRAMUSCULAR | Status: AC
  Administered 2018-05-20: 30 mg via INTRAMUSCULAR
  Filled 2018-05-20: qty 1

## 2018-05-20 MED ORDER — BENZONATATE 100 MG PO CAPS: 100.0000 mg | ORAL_CAPSULE | Freq: Three times a day (TID) | ORAL | 0 refills | Status: DC

## 2018-05-20 MED ORDER — HYDROMORPHONE HCL 1 MG/ML IJ SOLN
1.0000 mg | Freq: Once | INTRAMUSCULAR | Status: AC
  Administered 2018-05-20: 1 mg via INTRAMUSCULAR
  Filled 2018-05-20: qty 1

## 2018-05-20 MED ORDER — HYDROCOD POLST-CPM POLST ER 10-8 MG/5ML PO SUER: 5.0000 mL | Freq: Every evening | ORAL | 0 refills | Status: DC | PRN

## 2018-05-20 MED ORDER — ACETAMINOPHEN 500 MG PO TABS
1000.0000 mg | ORAL_TABLET | Freq: Once | ORAL | Status: AC
  Administered 2018-05-20: 1000 mg via ORAL
  Filled 2018-05-20: qty 2

## 2018-05-20 MED ORDER — METHOCARBAMOL 500 MG PO TABS
750.0000 mg | ORAL_TABLET | Freq: Once | ORAL | Status: AC
  Administered 2018-05-20: 750 mg via ORAL
  Filled 2018-05-20: qty 2

## 2018-05-20 MED ORDER — IBUPROFEN 600 MG PO TABS: 600.0000 mg | ORAL_TABLET | Freq: Four times a day (QID) | ORAL | 0 refills | Status: DC | PRN

## 2018-05-20 MED ORDER — ACETAMINOPHEN 325 MG PO TABS: 650.0000 mg | ORAL_TABLET | Freq: Four times a day (QID) | ORAL | 0 refills | Status: DC | PRN

## 2018-05-20 NOTE — Discharge Instructions (Addendum)
Alternate ibuprofen and Tylenol as prescribed for your fever and body aches.  This should also help your back pain.  Take Tessalon every 8 hours as needed for cough during the day and take Tussionex at night before bed.  Do not drive with Tussionex or combine it with any other narcotic such as Percocet.  Make sure to drink plenty of fluids and get plenty of rest.  Please follow-up with your doctor for further management of your back pain continues.  Use ice 3-4 times daily alternating 20 minutes on, 20 minutes off.  Please return to the emergency department if you develop any new or worsening symptoms.  Do not drink alcohol, drive, operate machinery or participate in any other potentially dangerous activities while taking opiate pain medication as it may make you sleepy. Do not take this medication with any other sedating medications, either prescription or over-the-counter. If you were prescribed Percocet or Vicodin, do not take these with acetaminophen (Tylenol) as it is already contained within these medications and overdose of Tylenol is dangerous.   This medication is an opiate (or narcotic) pain medication and can be habit forming.  Use it as little as possible to achieve adequate pain control.  Do not use or use it with extreme caution if you have a history of opiate abuse or dependence. This medication is intended for your use only - do not give any to anyone else and keep it in a secure place where nobody else, especially children, have access to it. It will also cause or worsen constipation, so you may want to consider taking an over-the-counter stool softener while you are taking this medication.

## 2018-05-20 NOTE — ED Notes (Signed)
Pt verbalized understanding of dc instructions.

## 2018-05-20 NOTE — ED Provider Notes (Signed)
[facility] EMERGENCY DEPARTMENT Provider Note   CSN: [id number] Arrival date & time: 05/20/18  8921     History   Chief Complaint Chief Complaint  Patient presents with  . Back Pain    HPI Alex [name] is a 53 y.o. male with history of hypertension, chronic back pain who presents with a 1 day history of right-sided back pain that began after he sneezed.  Patient also has flulike symptoms for the past 3 days.  He has been coughing and has had a fever.  He has been taking NyQuil at night only.  He reports he has not eaten in 2 days, but is tolerating fluids well.  Patient denies any numbness or tingling in his legs, saddle anesthesia, bowel or bladder incontinence.  He reports his back pain feels like a pulled muscle.  He had a lumbar fusion in 02/2018.  He had been doing well.  He denies any fevers prior to his flulike symptoms.  He denies any recent injection, cancer, history of IVDU, urinary symptoms.  Patient denies any sore throat, ear pain, chest pain, shortness of breath, however states he has been wheezing intermittently.  Patient also denies any abdominal pain, nausea, vomiting, diarrhea.  HPI  Past Medical History:  Diagnosis Date  . Chronic back pain   . Hypertension   . Migraine   . PTSD (post-traumatic stress disorder)     Patient Active Problem List   Diagnosis Date Noted  . HEMORRHOIDS-INTERNAL 08/18/2009  . BLOOD IN STOOL-MELENA 08/18/2009  . PERSONAL HX COLONIC POLYPS 08/18/2009  . RECTAL BLEEDING 05/28/2009  . OSTEOARTHRITIS 05/28/2009  . DEPRESSION 05/22/2009  . HYPERTENSION 05/22/2009  . LOW BACK PAIN 05/22/2009  . HEADACHE 05/22/2009  . RECTAL BLEEDING, HX OF 05/22/2009    Past Surgical History:  Procedure Laterality Date  . BACK SURGERY          Home Medications    Prior to Admission medications   Medication Sig Start Date End Date Taking? Authorizing Provider  acetaminophen (TYLENOL) 325 MG tablet Take 2 tablets (650 mg total) by  mouth every 6 (six) hours as needed for moderate pain or fever. 05/20/18   Ollis Daudelin, Waylan Boga, PA-C  benzonatate (TESSALON) 100 MG capsule Take 1 capsule (100 mg total) by mouth every 8 (eight) hours. 05/20/18   Trenton Passow, Waylan Boga, PA-C  chlorpheniramine-HYDROcodone (TUSSIONEX PENNKINETIC ER) 10-8 MG/5ML SUER Take 5 mLs by mouth at bedtime as needed for cough. 05/20/18   Emi Holes, PA-C  gabapentin (NEURONTIN) 300 MG capsule Take 1 capsule (300 mg total) by mouth 3 (three) times daily. 03/14/18   Benjiman Core, MD  hydrochlorothiazide (HYDRODIURIL) 12.5 MG tablet Take 12.5 mg by mouth daily.    [provider]  ibuprofen (ADVIL,MOTRIN) 600 MG tablet Take 1 tablet (600 mg total) by mouth every 6 (six) hours as needed. 05/20/18   Deshante Cassell, Waylan Boga, PA-C  lisinopril (PRINIVIL,ZESTRIL) 2.5 MG tablet Take 2.5 mg by mouth daily.    [provider]  methocarbamol (ROBAXIN) 500 MG tablet Take 1 tablet (500 mg total) by mouth every 8 (eight) hours as needed for muscle spasms. 03/14/18   Benjiman Core, MD  methylPREDNISolone (MEDROL DOSEPAK) 4 MG TBPK tablet Use per box 6-day taper instructions per 03/14/18   Benjiman Core, MD  nabumetone (RELAFEN) 500 MG tablet Take by mouth.    [provider]  venlafaxine (EFFEXOR) 100 MG tablet Take 250 mg by mouth once.    [provider]  Family History No family history on file.  Social History Social History   Tobacco Use  . Smoking status: Never Smoker  . Smokeless tobacco: Never Used  Substance Use Topics  . Alcohol use: Yes    Comment: occ  . Drug use: No     Allergies   Trazodone and nefazodone   Review of Systems Review of Systems  Constitutional: Positive for fever. Negative for chills.  HENT: Negative for facial swelling and sore throat.   Respiratory: Positive for cough. Negative for shortness of breath.   Cardiovascular: Negative for chest pain.  Gastrointestinal: Negative for abdominal pain,  nausea and vomiting.  Genitourinary: Negative for dysuria.  Musculoskeletal: Positive for back pain.  Skin: Negative for rash and wound.  Neurological: Negative for headaches.  Psychiatric/Behavioral: The patient is not nervous/anxious.      Physical Exam Updated Vital Signs BP (!) 138/98 (BP Location: Right Arm)   Pulse (!) 58   Temp 100.1 F (37.8 C) (Oral)   Resp 18   Ht 5\' 6"  (1.676 m)   Wt 81.6 kg   SpO2 98%   BMI 29.05 kg/m   Physical Exam Vitals signs and nursing note reviewed.  Constitutional:      General: He is not in acute distress.    Appearance: He is well-developed. He is not diaphoretic.  HENT:     Head: Normocephalic and atraumatic.     Right Ear: Tympanic membrane normal.     Left Ear: Tympanic membrane normal.     Mouth/Throat:     Pharynx: No oropharyngeal exudate or posterior oropharyngeal erythema.  Eyes:     General: No scleral icterus.       Right eye: No discharge.        Left eye: No discharge.     Conjunctiva/sclera: Conjunctivae normal.     Pupils: Pupils are equal, round, and reactive to light.  Neck:     Musculoskeletal: Normal range of motion and neck supple.     Thyroid: No thyromegaly.  Cardiovascular:     Rate and Rhythm: Normal rate and regular rhythm.     Heart sounds: Normal heart sounds. No murmur. No friction rub. No gallop.   Pulmonary:     Effort: Pulmonary effort is normal. No respiratory distress.     Breath sounds: No stridor. No wheezing or rales.     Comments: Diminished breath sounds bilaterally Abdominal:     General: Bowel sounds are normal. There is no distension.     Palpations: Abdomen is soft.     Tenderness: There is no abdominal tenderness. There is no guarding or rebound.  Musculoskeletal:     Comments: Tenderness on the R lumbar paraspinal region, no midline tenderness  Lymphadenopathy:     Cervical: No cervical adenopathy.  Skin:    General: Skin is warm and dry.     Coloration: Skin is not pale.      Findings: No rash.  Neurological:     Mental Status: He is alert.     Coordination: Coordination normal.     Comments: Normal sensation throughout; 5/5 strength in all 4 extremities; 2+ patellar reflexes bilaterally      ED Treatments / Results  Labs (all labs ordered are listed, but only abnormal results are displayed) Labs Reviewed - No data to display  EKG None  Radiology Dg Chest 2 View  Result Date: 05/20/2018 CLINICAL DATA:  Cough, fever. EXAM: CHEST - 2 VIEW COMPARISON:  Radiographs of March 02, 2016 FINDINGS: The heart size and mediastinal contours are within normal limits. Both lungs are clear. The visualized skeletal structures are unremarkable. IMPRESSION: No active cardiopulmonary disease. Electronically Signed   By: Lupita RaiderJames  Green Jr, M.D.   On: 05/20/2018 11:27   Dg Lumbar Spine Complete  Result Date: 05/20/2018 CLINICAL DATA:  Low back pain after cough. EXAM: LUMBAR SPINE - COMPLETE 4+ VIEW COMPARISON:  CT scan of March 11, 2018. FINDINGS: No fracture or spondylolisthesis is noted. Moderate degenerative disc disease is noted at L3-4 and L5-S1 with anterior osteophyte formation. Posterior facet joints are unremarkable. IMPRESSION: Moderate multilevel degenerative disc disease. No acute abnormality seen in the lumbar spine. Electronically Signed   By: Lupita RaiderJames  Green Jr, M.D.   On: 05/20/2018 11:26    Procedures Procedures (including critical care time)  Medications Ordered in ED Medications  ketorolac (TORADOL) 30 MG/ML injection 30 mg (30 mg Intramuscular Given 05/20/18 1039)  HYDROmorphone (DILAUDID) injection 1 mg (1 mg Intramuscular Given 05/20/18 1039)  acetaminophen (TYLENOL) tablet 1,000 mg (1,000 mg Oral Given 05/20/18 1039)  methocarbamol (ROBAXIN) tablet 750 mg (750 mg Oral Given 05/20/18 1039)     Initial Impression / Assessment and Plan / ED Course  I have reviewed the triage vital signs and the nursing notes.  Pertinent labs & imaging results that were  available during my care of the patient were reviewed by me and considered in my medical decision making (see chart for details).     Patient presenting with back pain after coughing.  He reports it feels like a muscle strain.  He denies any numbness or tingling.  Normal neuro exam without focal deficits.  Patient has associated flulike symptoms and fever is related to that more likely than his back pain.  He has no midline tenderness.  His x-ray is stable and completed due to recent surgery.  Patient feeling much better after IM Toradol and Dilaudid.  Patient will be given medications for his flu symptoms and cough.  Patient declines muscle relaxer as he states if he can just control his cough, his back will feel better.  Ice recommended.  Chest x-ray is clear.  Patient is tolerating oral fluids in the ED with stable vitals.  Return precautions discussed.  Patient understands and agrees with plan.  Patient vitals stable throughout ED course and discharged in satisfactory condition.  Final Clinical Impressions(s) / ED Diagnoses   Final diagnoses:  Influenza-like illness  Strain of lumbar region, initial encounter    ED Discharge Orders         Ordered    chlorpheniramine-HYDROcodone (TUSSIONEX PENNKINETIC ER) 10-8 MG/5ML SUER  At bedtime PRN     05/20/18 1149    benzonatate (TESSALON) 100 MG capsule  Every 8 hours     05/20/18 1149    ibuprofen (ADVIL,MOTRIN) 600 MG tablet  Every 6 hours PRN     05/20/18 1149    acetaminophen (TYLENOL) 325 MG tablet  Every 6 hours PRN     05/20/18 [id number]           Desa Rech, TecumsehAlexandra M, PA-C 05/21/18 1107    Arby BarrettePfeiffer, Marcy, MD 05/21/18 1322

## 2018-05-20 NOTE — ED Triage Notes (Signed)
Back pain this morning, states he threw his back out when he sneezed. Back surgery in November.

## 2018-07-02 ENCOUNTER — Other Ambulatory Visit: Payer: Self-pay

## 2018-07-02 ENCOUNTER — Emergency Department (HOSPITAL_BASED_OUTPATIENT_CLINIC_OR_DEPARTMENT_OTHER)
Admission: EM | Admit: 2018-07-02 | Discharge: 2018-07-02 | Disposition: A | Discharge status: Home / Self Care | Payer: Medicare Other | Attending: Emergency Medicine | Admitting: Emergency Medicine

## 2018-07-02 ENCOUNTER — Encounter (HOSPITAL_BASED_OUTPATIENT_CLINIC_OR_DEPARTMENT_OTHER): Payer: Self-pay

## 2018-07-02 DIAGNOSIS — M5416 Radiculopathy, lumbar region: Secondary | ICD-10-CM

## 2018-07-02 DIAGNOSIS — M545 Low back pain: Secondary | ICD-10-CM | POA: Diagnosis present

## 2018-07-02 DIAGNOSIS — Z79899 Other long term (current) drug therapy: Secondary | ICD-10-CM | POA: Insufficient documentation

## 2018-07-02 DIAGNOSIS — I10 Essential (primary) hypertension: Secondary | ICD-10-CM | POA: Diagnosis not present

## 2018-07-02 MED ORDER — ONDANSETRON 4 MG PO TBDP
4.0000 mg | ORAL_TABLET | Freq: Once | ORAL | Status: AC
  Administered 2018-07-02: 4 mg via ORAL

## 2018-07-02 MED ORDER — KETOROLAC TROMETHAMINE 60 MG/2ML IM SOLN
30.0000 mg | Freq: Once | INTRAMUSCULAR | Status: AC
  Administered 2018-07-02: 30 mg via INTRAMUSCULAR
  Filled 2018-07-02: qty 2

## 2018-07-02 MED ORDER — HYDROMORPHONE HCL 1 MG/ML IJ SOLN
1.0000 mg | Freq: Once | INTRAMUSCULAR | Status: AC
  Administered 2018-07-02: 1 mg via INTRAMUSCULAR
  Filled 2018-07-02: qty 1

## 2018-07-02 MED ORDER — ONDANSETRON 4 MG PO TBDP
ORAL_TABLET | ORAL | Status: AC
  Filled 2018-07-02: qty 1

## 2018-07-02 NOTE — ED Provider Notes (Signed)
[facility] EMERGENCY DEPARTMENT Provider Note   CSN: [id number] Arrival date & time: 07/02/18  2112    History   Chief Complaint Chief Complaint  Patient presents with  . Back Pain    HPI Alex [name] is a 53 y.o. male.     Patient with history of chronic back pain, lumbar radiculopathy, laminectomy performed in 02/2018, follows with pain management off of chronic narcotics as of 03/2018 --presents with exacerbation of back pain and right-sided lumbar radiculopathy.  Patient states that he has been doing fairly well since his surgery and is participating in PT.  Patient states that 2 days ago after intercourse he developed worsening lower back pain.  Pain on the right side starts in the buttocks and radiates down the thigh to the right knee.  It is consistent with previous flares of radiculopathy and sciatica.  He is able to walk but with much discomfort.  States that he took ibuprofen without relief.  He has plans to call his doctor tomorrow to hopefully schedule a steroid injection in his back.  These typically help him.  He does not get much relief from oral steroids.  Patient denies warning symptoms of back pain including: fecal incontinence, urinary retention or overflow incontinence, night sweats, waking from sleep with back pain, unexplained fevers or weight loss, h/o cancer, IVDU, recent trauma.         Past Medical History:  Diagnosis Date  . Chronic back pain   . Hypertension   . Migraine   . PTSD (post-traumatic stress disorder)     Patient Active Problem List   Diagnosis Date Noted  . HEMORRHOIDS-INTERNAL 08/18/2009  . BLOOD IN STOOL-MELENA 08/18/2009  . PERSONAL HX COLONIC POLYPS 08/18/2009  . RECTAL BLEEDING 05/28/2009  . OSTEOARTHRITIS 05/28/2009  . DEPRESSION 05/22/2009  . HYPERTENSION 05/22/2009  . LOW BACK PAIN 05/22/2009  . HEADACHE 05/22/2009  . RECTAL BLEEDING, HX OF 05/22/2009    Past Surgical History:  Procedure Laterality Date  .  BACK SURGERY          Home Medications    Prior to Admission medications   Medication Sig Start Date End Date Taking? Authorizing Provider  acetaminophen (TYLENOL) 325 MG tablet Take 2 tablets (650 mg total) by mouth every 6 (six) hours as needed for moderate pain or fever. 05/20/18   Law, Waylan Boga, PA-C  benzonatate (TESSALON) 100 MG capsule Take 1 capsule (100 mg total) by mouth every 8 (eight) hours. 05/20/18   Law, Waylan Boga, PA-C  chlorpheniramine-HYDROcodone (TUSSIONEX PENNKINETIC ER) 10-8 MG/5ML SUER Take 5 mLs by mouth at bedtime as needed for cough. 05/20/18   Emi Holes, PA-C  gabapentin (NEURONTIN) 300 MG capsule Take 1 capsule (300 mg total) by mouth 3 (three) times daily. 03/14/18   Benjiman Core, MD  hydrochlorothiazide (HYDRODIURIL) 12.5 MG tablet Take 12.5 mg by mouth daily.    [provider]  ibuprofen (ADVIL,MOTRIN) 600 MG tablet Take 1 tablet (600 mg total) by mouth every 6 (six) hours as needed. 05/20/18   Law, Waylan Boga, PA-C  lisinopril (PRINIVIL,ZESTRIL) 2.5 MG tablet Take 2.5 mg by mouth daily.    [provider]  methocarbamol (ROBAXIN) 500 MG tablet Take 1 tablet (500 mg total) by mouth every 8 (eight) hours as needed for muscle spasms. 03/14/18   Benjiman Core, MD  methylPREDNISolone (MEDROL DOSEPAK) 4 MG TBPK tablet Use per box 6-day taper instructions per 03/14/18   Benjiman Core, MD  nabumetone (RELAFEN) 500 MG  tablet Take by mouth.    [provider]  venlafaxine (EFFEXOR) 100 MG tablet Take 250 mg by mouth once.    [provider]    Family History No family history on file.  Social History Social History   Tobacco Use  . Smoking status: Never Smoker  . Smokeless tobacco: Never Used  Substance Use Topics  . Alcohol use: Yes    Comment: occ  . Drug use: No     Allergies   Trazodone and nefazodone   Review of Systems Review of Systems  Constitutional: Negative for fever and unexpected  weight change.  Gastrointestinal: Negative for constipation.       Neg for fecal incontinence  Genitourinary: Negative for difficulty urinating, flank pain and hematuria.       Negative for urinary incontinence or retention  Musculoskeletal: Positive for back pain, gait problem and myalgias.  Neurological: Negative for weakness and numbness.       Negative for saddle paresthesias      Physical Exam Updated Vital Signs BP (!) 151/111 (BP Location: Right Arm)   Pulse (!) 50   Temp 98.4 F (36.9 C) (Oral)   Resp 18   Ht 5\' 7"  (1.702 m)   Wt 83.5 kg   SpO2 97%   BMI 28.82 kg/m   Physical Exam Vitals signs and nursing note reviewed.  Constitutional:      Appearance: He is well-developed.  HENT:     Head: Normocephalic and atraumatic.  Eyes:     Conjunctiva/sclera: Conjunctivae normal.  Neck:     Musculoskeletal: Normal range of motion.  Abdominal:     Palpations: Abdomen is soft.     Tenderness: There is no abdominal tenderness.  Musculoskeletal: Normal range of motion.        General: No tenderness.     Comments: No step-off noted with palpation of spine.  Minimal tenderness to palpation over the right paraspinous musculature of the lower lumbar spine.  Patient is able to shift his weight and sit on the side of the bed however appears very uncomfortable doing this.  He is ambulatory without foot drop.  Skin:    General: Skin is warm and dry.  Neurological:     Mental Status: He is alert.     Sensory: No sensory deficit.     Motor: No abnormal muscle tone.     Deep Tendon Reflexes: Reflexes are normal and symmetric.     Comments: 5/5 strength in entire lower extremities bilaterally. No sensation deficit.       ED Treatments / Results  Labs (all labs ordered are listed, but only abnormal results are displayed) Labs Reviewed - No data to display  EKG None  Radiology No results found.  Procedures Procedures (including critical care time)  Medications Ordered  in ED Medications  HYDROmorphone (DILAUDID) injection 1 mg (has no administration in time range)  ketorolac (TORADOL) injection 30 mg (has no administration in time range)     Initial Impression / Assessment and Plan / ED Course  I have reviewed the triage vital signs and the nursing notes.  Pertinent labs & imaging results that were available during my care of the patient were reviewed by me and considered in my medical decision making (see chart for details).        9:38 PM Patient seen and examined. Medications ordered.  No red flags.  Reviewed [facility] substance reporting database.  Reviewed patient's pain management notes, most recent 04/2018.  Patient history is consistent with his document history.  Will treat pain and discharge.  Patient agreeable to plan.  Vital signs reviewed and are as follows: Vitals:   07/02/18 2118  BP: (!) 151/111  Pulse: (!) 50  Resp: 18  Temp: 98.4 F (36.9 C)  SpO2: 97%    No red flag s/s of low back pain. Patient was counseled on back pain precautions and told to do activity as tolerated but do not lift, push, or pull heavy objects more than 10 pounds for the next week.  Patient counseled to use ice or heat on back for no longer than 15 minutes every hour.   Patient urged to follow-up with his doctor as planned.  Urged to return with worsening severe pain, loss of bowel or bladder control, trouble walking.   The patient verbalizes understanding and agrees with the plan.   Final Clinical Impressions(s) / ED Diagnoses   Final diagnoses:  Lumbar radiculopathy   Patient with back pain consistent with lumbar radiculopathy, well-documented history of same. No new neurological deficits. Patient is ambulatory. No warning symptoms of back pain including: fecal incontinence, urinary retention or overflow incontinence, night sweats, waking from sleep with back pain, unexplained fevers or weight loss, h/o cancer, IVDU, recent trauma. No concern  for cauda equina, epidural abscess, or other serious cause of back pain. Conservative measures such as rest, ice/heat and pain medicine indicated with PCP follow-up if no improvement with conservative management.    ED Discharge Orders    None       Renne Crigler, PA-C 07/02/18 2245    Rolan Bucco, MD 07/02/18 2306

## 2018-07-02 NOTE — ED Notes (Signed)
Pt verbalizes understanding of d/c instructions and denies any further need at this time. 

## 2018-07-02 NOTE — ED Triage Notes (Signed)
Pt c/o chronic lower back pain, worse since Saturday, not relieved with motrin. Pt denies groin numbness, denies incontinence

## 2018-07-02 NOTE — Discharge Instructions (Signed)
Please read and follow all provided instructions.  Your diagnoses today include:  1. Lumbar radiculopathy     Tests performed today include:  Vital signs - see below for your results today  Medications prescribed:   None  Take any prescribed medications only as directed.  Home care instructions:   Follow any educational materials contained in this packet  Please rest, use ice or heat on your back for the next several days  Do not lift, push, pull anything more than 10 pounds for the next week  Follow-up instructions: Please follow-up with your pain management doctors as planned.   Return instructions:  SEEK IMMEDIATE MEDICAL ATTENTION IF YOU HAVE:  New numbness, tingling, weakness, or problem with the use of your arms or legs  Severe back pain not relieved with medications  Loss control of your bowels or bladder  Increasing pain in any areas of the body (such as chest or abdominal pain)  Shortness of breath, dizziness, or fainting.   Worsening nausea (feeling sick to your stomach), vomiting, fever, or sweats  Any other emergent concerns regarding your health   Additional Information:  Your vital signs today were: BP (!) 151/111 (BP Location: Right Arm)    Pulse (!) 50    Temp 98.4 F (36.9 C) (Oral)    Resp 18    Ht 5\' 7"  (1.702 m)    Wt 83.5 kg    SpO2 97%    BMI 28.82 kg/m  If your blood pressure (BP) was elevated above 135/85 this visit, please have this repeated by your doctor within one month. --------------

## 2018-07-14 ENCOUNTER — Other Ambulatory Visit: Payer: Self-pay

## 2018-07-14 ENCOUNTER — Encounter (HOSPITAL_BASED_OUTPATIENT_CLINIC_OR_DEPARTMENT_OTHER): Payer: Self-pay | Admitting: Emergency Medicine

## 2018-07-14 ENCOUNTER — Emergency Department (HOSPITAL_BASED_OUTPATIENT_CLINIC_OR_DEPARTMENT_OTHER)
Admission: EM | Admit: 2018-07-14 | Discharge: 2018-07-14 | Disposition: A | Discharge status: Home / Self Care | Payer: Medicare Other | Attending: Emergency Medicine | Admitting: Emergency Medicine

## 2018-07-14 DIAGNOSIS — Z79899 Other long term (current) drug therapy: Secondary | ICD-10-CM | POA: Insufficient documentation

## 2018-07-14 DIAGNOSIS — M5432 Sciatica, left side: Secondary | ICD-10-CM | POA: Diagnosis not present

## 2018-07-14 DIAGNOSIS — I10 Essential (primary) hypertension: Secondary | ICD-10-CM | POA: Insufficient documentation

## 2018-07-14 DIAGNOSIS — M545 Low back pain: Secondary | ICD-10-CM | POA: Diagnosis present

## 2018-07-14 MED ORDER — ONDANSETRON 4 MG PO TBDP
ORAL_TABLET | ORAL | Status: AC
  Filled 2018-07-14: qty 1

## 2018-07-14 MED ORDER — HYDROMORPHONE HCL 2 MG PO TABS: 2.0000 mg | ORAL_TABLET | ORAL | 0 refills | Status: DC | PRN

## 2018-07-14 MED ORDER — KETOROLAC TROMETHAMINE 30 MG/ML IJ SOLN
30.0000 mg | Freq: Once | INTRAMUSCULAR | Status: AC
  Administered 2018-07-14: 30 mg via INTRAMUSCULAR
  Filled 2018-07-14: qty 1

## 2018-07-14 MED ORDER — ONDANSETRON 4 MG PO TBDP
4.0000 mg | ORAL_TABLET | Freq: Once | ORAL | Status: AC
  Administered 2018-07-14: 4 mg via ORAL

## 2018-07-14 MED ORDER — HYDROMORPHONE HCL 1 MG/ML IJ SOLN
1.0000 mg | Freq: Once | INTRAMUSCULAR | Status: AC
  Administered 2018-07-14: 1 mg via INTRAMUSCULAR
  Filled 2018-07-14: qty 1

## 2018-07-14 NOTE — ED Triage Notes (Signed)
Pt c/o back pain. Pt has hx of chronic back pain.

## 2018-07-14 NOTE — ED Provider Notes (Signed)
[facility] Provider Note: [name], MD, FACEP  CSN: [id number] MRN: [id number] ARRIVAL: 07/14/18 at 0550 ROOM: [facility]05/[facility]   CHIEF COMPLAINT  Back Pain   HISTORY OF PRESENT ILLNESS  07/14/18 6:02 AM Alex [name] is a 53 y.o. male with a history of chronic back pain.  He is here with 2 days of left-sided low back pain radiating down the back of his left leg.  Symptoms are similar to previous episodes of sciatica which have usually been present on the right side.  He denies any new numbness or weakness with this.  He denies bowel or bladder changes.  He describes the pain is sharp and worse with movement of the lower back or the left hip.  He has taken ibuprofen with out adequate relief.  He describes the pain as an 8 out of 10.  He had an appointment pending for an epidural injection but this was canceled due to COVID-19.    Past Medical History:  Diagnosis Date  . Chronic back pain   . Hypertension   . Migraine   . PTSD (post-traumatic stress disorder)     Past Surgical History:  Procedure Laterality Date  . BACK SURGERY      No family history on file.  Social History   Tobacco Use  . Smoking status: Never Smoker  . Smokeless tobacco: Never Used  Substance Use Topics  . Alcohol use: Yes    Comment: occ  . Drug use: No    Prior to Admission medications   Medication Sig Start Date End Date Taking? Authorizing Provider  acetaminophen (TYLENOL) 325 MG tablet Take 2 tablets (650 mg total) by mouth every 6 (six) hours as needed for moderate pain or fever. 05/20/18   Law, Waylan Boga, PA-C  benzonatate (TESSALON) 100 MG capsule Take 1 capsule (100 mg total) by mouth every 8 (eight) hours. 05/20/18   Law, Waylan Boga, PA-C  chlorpheniramine-HYDROcodone (TUSSIONEX PENNKINETIC ER) 10-8 MG/5ML SUER Take 5 mLs by mouth at bedtime as needed for cough. 05/20/18   Emi Holes, PA-C  gabapentin (NEURONTIN) 300 MG capsule Take 1 capsule (300 mg total) by mouth 3 (three)  times daily. 03/14/18   Benjiman Core, MD  hydrochlorothiazide (HYDRODIURIL) 12.5 MG tablet Take 12.5 mg by mouth daily.    [provider]  ibuprofen (ADVIL,MOTRIN) 600 MG tablet Take 1 tablet (600 mg total) by mouth every 6 (six) hours as needed. 05/20/18   Law, Waylan Boga, PA-C  lisinopril (PRINIVIL,ZESTRIL) 2.5 MG tablet Take 2.5 mg by mouth daily.    [provider]  methocarbamol (ROBAXIN) 500 MG tablet Take 1 tablet (500 mg total) by mouth every 8 (eight) hours as needed for muscle spasms. 03/14/18   Benjiman Core, MD  methylPREDNISolone (MEDROL DOSEPAK) 4 MG TBPK tablet Use per box 6-day taper instructions per 03/14/18   Benjiman Core, MD  nabumetone (RELAFEN) 500 MG tablet Take by mouth.    [provider]  venlafaxine (EFFEXOR) 100 MG tablet Take 250 mg by mouth once.    [provider]    Allergies Trazodone and nefazodone   REVIEW OF SYSTEMS  Negative except as noted here or in the History of Present Illness.   PHYSICAL EXAMINATION  Initial Vital Signs Blood pressure (!) 156/109, pulse (!) 59, temperature 98.2 F (36.8 C), temperature source Oral, resp. rate 18, height 5\' 7"  (1.702 m), weight 83.4 kg, SpO2 99 %.  Examination General: Well-developed, well-nourished male in no acute distress;  appearance consistent with age of record HENT: normocephalic; atraumatic Eyes: Normal appearance Neck: supple Heart: regular rate and rhythm Lungs: clear to auscultation bilaterally Abdomen: soft; nondistended Back: Left paralumbar tenderness; positive straight leg raise on the left at 45 degrees Extremities: No deformity; full range of motion Neurologic: Awake, alert and oriented; motor function intact in all extremities and symmetric; no facial droop Skin: Warm and dry Psychiatric: Normal mood and affect   RESULTS  Summary of this visit's results, reviewed by myself:   EKG Interpretation  Date/Time:    Ventricular Rate:    PR  Interval:    QRS Duration:   QT Interval:    QTC Calculation:   R Axis:     Text Interpretation:        Laboratory Studies: No results found for this or any previous visit (from the past 24 hour(s)). Imaging Studies: No results found.  ED COURSE and MDM  Nursing notes and initial vitals signs, including pulse oximetry, reviewed.  Vitals:   07/14/18 0557 07/14/18 0558  BP: (!) 156/109   Pulse: (!) 59   Resp: 18   Temp: 98.2 F (36.8 C)   TempSrc: Oral   SpO2: 99%   Weight:  83.4 kg  Height:  5\' 7"  (1.702 m)   We will treat patient with a brief course of hydrocodone and he will follow-up with his physician at [facility] on Monday.  PROCEDURES    ED DIAGNOSES     ICD-10-CM   1. Sciatica of left side M54.32        Lanson Randle, Jonny Ruiz, MD 07/14/18 [phone]

## 2018-07-14 NOTE — ED Triage Notes (Signed)
Pt states "last time I was here they only gave me 1 mg of Dilaudid and that wasn't enough."

## 2018-10-01 ENCOUNTER — Emergency Department (HOSPITAL_BASED_OUTPATIENT_CLINIC_OR_DEPARTMENT_OTHER)
Admission: EM | Admit: 2018-10-01 | Discharge: 2018-10-01 | Disposition: A | Discharge status: Home / Self Care | Payer: Medicare Other | Attending: Emergency Medicine | Admitting: Emergency Medicine

## 2018-10-01 ENCOUNTER — Other Ambulatory Visit: Payer: Self-pay

## 2018-10-01 ENCOUNTER — Encounter (HOSPITAL_BASED_OUTPATIENT_CLINIC_OR_DEPARTMENT_OTHER): Payer: Self-pay | Admitting: *Deleted

## 2018-10-01 DIAGNOSIS — M5489 Other dorsalgia: Secondary | ICD-10-CM | POA: Diagnosis present

## 2018-10-01 DIAGNOSIS — I10 Essential (primary) hypertension: Secondary | ICD-10-CM | POA: Insufficient documentation

## 2018-10-01 DIAGNOSIS — G8929 Other chronic pain: Secondary | ICD-10-CM

## 2018-10-01 DIAGNOSIS — M5442 Lumbago with sciatica, left side: Secondary | ICD-10-CM | POA: Insufficient documentation

## 2018-10-01 DIAGNOSIS — Z79899 Other long term (current) drug therapy: Secondary | ICD-10-CM | POA: Insufficient documentation

## 2018-10-01 MED ORDER — KETOROLAC TROMETHAMINE 60 MG/2ML IM SOLN
30.0000 mg | Freq: Once | INTRAMUSCULAR | Status: AC
  Administered 2018-10-01: 30 mg via INTRAMUSCULAR
  Filled 2018-10-01: qty 2

## 2018-10-01 MED ORDER — METOCLOPRAMIDE HCL 5 MG/ML IJ SOLN
10.0000 mg | Freq: Once | INTRAMUSCULAR | Status: AC
  Administered 2018-10-01: 10 mg via INTRAMUSCULAR

## 2018-10-01 MED ORDER — METOCLOPRAMIDE HCL 5 MG/ML IJ SOLN
INTRAMUSCULAR | Status: AC
  Filled 2018-10-01: qty 2

## 2018-10-01 MED ORDER — HYDROMORPHONE HCL 1 MG/ML IJ SOLN
1.0000 mg | Freq: Once | INTRAMUSCULAR | Status: AC
  Administered 2018-10-01: 1 mg via INTRAMUSCULAR
  Filled 2018-10-01: qty 1

## 2018-10-01 NOTE — ED Triage Notes (Addendum)
Pt states he had a diagnostic injection done on Friday. States this did not relief his back pain. States this morning his lower back pain is  worse. States pain radiates into his abdomen. States pain is constant, burning, and throbbing. Denies any injury. Has not taken anything for pain other than "ice" Pt states when the pain gets this bad the only thing that helps is the"dilaudid injection"Pt states he is on a pain management program.

## 2018-10-01 NOTE — ED Notes (Signed)
RN in room to administer pain medication, pt had lowered self to floor and kneeling over bed. RN informed patient of intent to administer meds, pt states he needs nausea medications. Informed patient that nausea medications had not been ordered at this time. "That stuff's gonna make me throw up." " Damn I told him three times." Will hold meds per patient's request until antiemetics are ordered.

## 2018-10-01 NOTE — ED Provider Notes (Addendum)
[facility] EMERGENCY DEPARTMENT Provider Note  CSN: [id number] Arrival date & time: 10/01/18 [id number]  Chief Complaint(s) back pain  HPI Alex SquibbSean [name] is a 53 y.o. male with a history of chronic back pain secondary to lumbar stenosis status post laminectomy several years ago as well as recent intra-articular injections by pain management.  He reports that he had facet blocks and numerous levels in the lumbar region 3 days ago for bilateral lower back pain.  He reports that he had absolutely no relief from these injections on the left and has had persistent pain.   There is no acute change in his pain.  He endorses typical radicular radiation to the left upper thigh.  He did report improved pain on the right.  He denies any lower extremity weakness, loss of sensation, bladder/bowel incontinence.  No fevers or chills.  Patient has tried ice and over-the-counter Motrin without relief.  HPI  Past Medical History Past Medical History:  Diagnosis Date  . Chronic back pain   . Hypertension   . Migraine   . PTSD (post-traumatic stress disorder)    Patient Active Problem List   Diagnosis Date Noted  . HEMORRHOIDS-INTERNAL 08/18/2009  . BLOOD IN STOOL-MELENA 08/18/2009  . PERSONAL HX COLONIC POLYPS 08/18/2009  . RECTAL BLEEDING 05/28/2009  . OSTEOARTHRITIS 05/28/2009  . DEPRESSION 05/22/2009  . HYPERTENSION 05/22/2009  . LOW BACK PAIN 05/22/2009  . HEADACHE 05/22/2009  . RECTAL BLEEDING, HX OF 05/22/2009   Home Medication(s) Prior to Admission medications   Medication Sig Start Date End Date Taking? Authorizing Provider  acetaminophen (TYLENOL) 325 MG tablet Take 2 tablets (650 mg total) by mouth every 6 (six) hours as needed for moderate pain or fever. 05/20/18   Law, Waylan BogaAlexandra M, PA-C  benzonatate (TESSALON) 100 MG capsule Take 1 capsule (100 mg total) by mouth every 8 (eight) hours. 05/20/18   Law, Waylan BogaAlexandra M, PA-C  chlorpheniramine-HYDROcodone (TUSSIONEX PENNKINETIC ER) 10-8  MG/5ML SUER Take 5 mLs by mouth at bedtime as needed for cough. 05/20/18   Emi HolesLaw, Alexandra M, PA-C  gabapentin (NEURONTIN) 300 MG capsule Take 1 capsule (300 mg total) by mouth 3 (three) times daily. 03/14/18   Benjiman CorePickering, Nathan, MD  hydrochlorothiazide (HYDRODIURIL) 12.5 MG tablet Take 12.5 mg by mouth daily.    [provider]  ibuprofen (ADVIL,MOTRIN) 600 MG tablet Take 1 tablet (600 mg total) by mouth every 6 (six) hours as needed. 05/20/18   Law, Waylan BogaAlexandra M, PA-C  lisinopril (PRINIVIL,ZESTRIL) 2.5 MG tablet Take 2.5 mg by mouth daily.    [provider]  nabumetone (RELAFEN) 500 MG tablet Take by mouth.    [provider]  venlafaxine (EFFEXOR) 100 MG tablet Take 250 mg by mouth once.    [provider]  Past Surgical History Past Surgical History:  Procedure Laterality Date  . BACK SURGERY     Family History No family history on file.  Social History Social History   Tobacco Use  . Smoking status: Never Smoker  . Smokeless tobacco: Never Used  Substance Use Topics  . Alcohol use: Yes    Comment: occ  . Drug use: No   Allergies Trazodone and nefazodone  Review of Systems Review of Systems All other systems are reviewed and are negative for acute change except as noted in the HPI  Physical Exam Vital Signs  I have reviewed the triage vital signs HR 90  BP (!) 191/100   Temp 98.1 F (36.7 C)   Resp 20   Ht 5\' 7"  (1.702 m)   Wt 81.6 kg   SpO2 100%   BMI 28.19 kg/m   Physical Exam Vitals signs reviewed.  Constitutional:      General: He is not in acute distress.    Appearance: He is well-developed. He is not diaphoretic.     Comments: In obvious discomfort  HENT:     Head: Normocephalic and atraumatic.     Jaw: No trismus.     Right Ear: External ear normal.     Left Ear: External ear normal.      Nose: Nose normal.  Eyes:     General: No scleral icterus.    Conjunctiva/sclera: Conjunctivae normal.  Neck:     Musculoskeletal: Normal range of motion.     Trachea: Phonation normal.  Cardiovascular:     Rate and Rhythm: Normal rate and regular rhythm.  Pulmonary:     Effort: Pulmonary effort is normal. No respiratory distress.     Breath sounds: No stridor.  Abdominal:     General: There is no distension.  Musculoskeletal: Normal range of motion.     Lumbar back: He exhibits tenderness and spasm. He exhibits no bony tenderness.       Back:  Neurological:     Mental Status: He is alert and oriented to person, place, and time.     Comments: Spine Exam: Strength: 5/5 throughout LE bilaterally  Sensation: Intact to light touch in proximal and distal LE bilaterally Reflexes: 1+  achilles reflexes   Psychiatric:        Behavior: Behavior normal.     ED Results and Treatments Labs (all labs ordered are listed, but only abnormal results are displayed) Labs Reviewed - No data to display                                                                                                                       EKG  EKG Interpretation  Date/Time:    Ventricular Rate:    PR Interval:    QRS Duration:   QT Interval:    QTC Calculation:   R Axis:     Text Interpretation:        Radiology No results found. Pertinent labs & imaging results  that were available during my care of the patient were reviewed by me and considered in my medical decision making (see chart for details).  Medications Ordered in ED Medications  metoCLOPramide (REGLAN) 5 MG/ML injection (has no administration in time range)  HYDROmorphone (DILAUDID) injection 1 mg (1 mg Intramuscular Given 10/01/18 0624)  ketorolac (TORADOL) injection 30 mg (30 mg Intramuscular Given 10/01/18 0624)  metoCLOPramide (REGLAN) injection 10 mg (10 mg Intramuscular Given 10/01/18 2947)                                                                                                                                     Procedures Procedures  (including critical care time)  Medical Decision Making / ED Course I have reviewed the nursing notes for this encounter and the patient's prior records (if available in EHR or on provided paperwork).    53 y.o. male presents with back pain in lumbar area with signs of radicular pain. No acute traumatic onset. No red flag symptoms of fever, weight loss, saddle anesthesia, weakness, fecal/urinary incontinence or urinary retention.   Patient did have recent injections but they were not intrathecal.  There is been no change to the patient's pain which began prior to the injections.  This pain is similar to prior pain in the past.  Not suggestive of cauda equina, epidural abscess or hematoma.  Provided with IM pain medication. Pain improved. Ambulating w/o complication. He decline Rx. Has schedule phone conversation with pain clinic later today.  The patient appears reasonably screened and/or stabilized for discharge and I doubt any other medical condition or other [facility] requiring further screening, evaluation, or treatment in the ED at this time prior to discharge.  The patient is safe for discharge with strict return precautions.   Final Clinical Impression(s) / ED Diagnoses Final diagnoses:  Chronic left-sided low back pain with left-sided sciatica    Disposition: Discharge  Condition: Good  I have discussed the results, Dx and Tx plan with the patient who expressed understanding and agree(s) with the plan. Discharge instructions discussed at great length. The patient was given strict return precautions who verbalized understanding of the instructions. No further questions at time of discharge.    ED Discharge Orders    None      [facility] narcotic database reviewed and no active prescriptions noted.   Follow Up: Sheliah Mends, MD [place] [id number] [phone]  Call       This chart was dictated using voice recognition software.  Despite best efforts to proofread,  errors can occur which can change the documentation meaning.     Fatima Blank, MD 10/01/18 [phone]

## 2018-10-01 NOTE — ED Notes (Signed)
ED Provider at bedside. 

## 2019-01-12 ENCOUNTER — Emergency Department (HOSPITAL_BASED_OUTPATIENT_CLINIC_OR_DEPARTMENT_OTHER)
Admission: EM | Admit: 2019-01-12 | Discharge: 2019-01-12 | Disposition: A | Discharge status: Home / Self Care | Payer: No Typology Code available for payment source | Attending: Emergency Medicine | Admitting: Emergency Medicine

## 2019-01-12 ENCOUNTER — Encounter (HOSPITAL_BASED_OUTPATIENT_CLINIC_OR_DEPARTMENT_OTHER): Payer: Self-pay | Admitting: Emergency Medicine

## 2019-01-12 ENCOUNTER — Other Ambulatory Visit: Payer: Self-pay

## 2019-01-12 DIAGNOSIS — Z888 Allergy status to other drugs, medicaments and biological substances status: Secondary | ICD-10-CM | POA: Insufficient documentation

## 2019-01-12 DIAGNOSIS — G8929 Other chronic pain: Secondary | ICD-10-CM | POA: Insufficient documentation

## 2019-01-12 DIAGNOSIS — M549 Dorsalgia, unspecified: Secondary | ICD-10-CM

## 2019-01-12 DIAGNOSIS — I10 Essential (primary) hypertension: Secondary | ICD-10-CM | POA: Insufficient documentation

## 2019-01-12 DIAGNOSIS — M545 Low back pain: Secondary | ICD-10-CM | POA: Diagnosis present

## 2019-01-12 DIAGNOSIS — Z79899 Other long term (current) drug therapy: Secondary | ICD-10-CM | POA: Diagnosis not present

## 2019-01-12 MED ORDER — OXYCODONE-ACETAMINOPHEN 5-325 MG PO TABS
2.0000 | ORAL_TABLET | Freq: Once | ORAL | Status: AC
  Administered 2019-01-12: 2 via ORAL
  Filled 2019-01-12: qty 2

## 2019-01-12 NOTE — Discharge Instructions (Addendum)
Please follow-up with your pain management clinic as needed.

## 2019-01-12 NOTE — ED Triage Notes (Signed)
Patient states that he has pain to his lower back. Hx of the same. He reports that this has been worse over the last 3 days

## 2019-01-12 NOTE — ED Notes (Signed)
Pt reports having driver. Pt aware a driver needed r/t receiving pain medication

## 2019-01-12 NOTE — ED Provider Notes (Signed)
[facility] EMERGENCY DEPARTMENT Provider Note   CSN: [id number] Arrival date & time: 01/12/19  1341     History   Chief Complaint Chief Complaint  Patient presents with   Back Pain    HPI Alex [name] is a 53 y.o. male.     53 y.o male with a PMH of Chronic back pain, PTSD presents to the ED requesting his 3 month dilaudid injection for his back pain. Patient states " I have a lot of back issues if you look through my chart, you see I get a shot of dilaudid every 3 months here". He is currently followed by Pain clinic at [facility], he is schedule for a sacroiliac injection on Wednesday but states he is unable to make it to his appointment due to the pain.  He describes a sharp sensation to his back radiating down, this is consistent with his previous back issues.  No red flags such as fever, IV drug use, bowel or bladder dysfunction.  The history is provided by medical records and the patient.    Past Medical History:  Diagnosis Date   Chronic back pain    Hypertension    Migraine    PTSD (post-traumatic stress disorder)     Patient Active Problem List   Diagnosis Date Noted   HEMORRHOIDS-INTERNAL 08/18/2009   BLOOD IN STOOL-MELENA 08/18/2009   PERSONAL HX COLONIC POLYPS 08/18/2009   RECTAL BLEEDING 05/28/2009   OSTEOARTHRITIS 05/28/2009   DEPRESSION 05/22/2009   HYPERTENSION 05/22/2009   LOW BACK PAIN 05/22/2009   HEADACHE 05/22/2009   RECTAL BLEEDING, HX OF 05/22/2009    Past Surgical History:  Procedure Laterality Date   BACK SURGERY          Home Medications    Prior to Admission medications   Medication Sig Start Date End Date Taking? Authorizing Provider  acetaminophen (TYLENOL) 325 MG tablet Take 2 tablets (650 mg total) by mouth every 6 (six) hours as needed for moderate pain or fever. 05/20/18   Law, Waylan Boga, PA-C  benzonatate (TESSALON) 100 MG capsule Take 1 capsule (100 mg total) by mouth every 8 (eight) hours. 05/20/18    Law, Waylan Boga, PA-C  chlorpheniramine-HYDROcodone (TUSSIONEX PENNKINETIC ER) 10-8 MG/5ML SUER Take 5 mLs by mouth at bedtime as needed for cough. 05/20/18   Emi Holes, PA-C  gabapentin (NEURONTIN) 300 MG capsule Take 1 capsule (300 mg total) by mouth 3 (three) times daily. 03/14/18   Benjiman Core, MD  hydrochlorothiazide (HYDRODIURIL) 12.5 MG tablet Take 12.5 mg by mouth daily.    [provider]  ibuprofen (ADVIL,MOTRIN) 600 MG tablet Take 1 tablet (600 mg total) by mouth every 6 (six) hours as needed. 05/20/18   Law, Waylan Boga, PA-C  lisinopril (PRINIVIL,ZESTRIL) 2.5 MG tablet Take 2.5 mg by mouth daily.    [provider]  nabumetone (RELAFEN) 500 MG tablet Take by mouth.    [provider]  venlafaxine (EFFEXOR) 100 MG tablet Take 250 mg by mouth once.    [provider]    Family History History reviewed. No pertinent family history.  Social History Social History   Tobacco Use   Smoking status: Never Smoker   Smokeless tobacco: Never Used  Substance Use Topics   Alcohol use: Yes    Comment: occ   Drug use: No     Allergies   Trazodone and nefazodone   Review of Systems Review of Systems  Constitutional: Negative for fever.  Musculoskeletal: Positive  for back pain.     Physical Exam Updated Vital Signs BP (!) 152/105 (BP Location: Left Arm)    Pulse (!) 57    Temp 98.6 F (37 C) (Oral)    Resp 20    Ht 5\' 7"  (1.702 m)    Wt 81.6 kg    SpO2 98%    BMI 28.19 kg/m   Physical Exam Vitals signs and nursing note reviewed.  Constitutional:      Appearance: Normal appearance.     Comments: Non ill appearing.   HENT:     Mouth/Throat:     Mouth: Mucous membranes are dry.  Eyes:     Pupils: Pupils are equal, round, and reactive to light.  Cardiovascular:     Rate and Rhythm: Normal rate and regular rhythm.  Pulmonary:     Effort: Pulmonary effort is normal.     Breath sounds: Normal breath sounds.  Abdominal:      Palpations: Abdomen is soft.  Musculoskeletal:       Back:  Neurological:     Mental Status: He is alert and oriented to person, place, and time.      ED Treatments / Results  Labs (all labs ordered are listed, but only abnormal results are displayed) Labs Reviewed - No data to display  EKG None  Radiology No results found.  Procedures Procedures (including critical care time)  Medications Ordered in ED Medications  oxyCODONE-acetaminophen (PERCOCET/ROXICET) 5-325 MG per tablet 2 tablet (2 tablets Oral Given 01/12/19 1541)     Initial Impression / Assessment and Plan / ED Course  I have reviewed the triage vital signs and the nursing notes.  Pertinent labs & imaging results that were available during my care of the patient were reviewed by me and considered in my medical decision making (see chart for details).      Patient with a past medical history of chronic back pain, with a significant amount of surgical history to his back, sent to the ED with complaints of back pain. I personally reviewed his chart at length, with the following prior surgical history:   [place] N/A 02/26/2018  Procedure: [place], L3-4 Metrex set, Davonna Belling, C-arm; Surgeon: Polo Riley, MD; Location: [phone] MAIN OR; Service: Neurosurgery; Laterality: N/A;   LUMBAR MEDIAL BRANCH BLOCK Bilateral 09/28/2018  Procedure: LUMBAR MEDIAL BRANCH BLOCK #1 BILATERAL L4-5, L5-S1; Surgeon: Darrel Hoover, MD; Location: [place]; Service: [facility]; Laterality: Bilateral;   LUMBAR TRANSFORAMINAL EPIDURAL INJECTION Bilateral 03/30/2018  Procedure: LUMBAR TRANSFORAMINAL EPIDURAL INJECTION L4-5; Surgeon: Darrel Hoover, MD; Location: [place]; Service: [facility]; Laterality: Bilateral;   LUMBAR TRANSFORAMINAL EPIDURAL INJECTION Bilateral 08/03/2018  Procedure: LUMBAR TRANSFORAMINAL EPIDURAL INJECTION, bilateral Lumbar 4-5; Surgeon:  Darrel Hoover, MD; Location: [place]; Service: [facility]; Laterality: Bilateral;   lymph node surgery   SACROILIAC JOINT INJECTION Bilateral 11/09/2018   Patient reports he is currently not on any narcotic medication for his back pain, this is accurate as I have reviewed the PDMP.  However is currently followed by the pain clinic at [facility].  He reports he has an appointment scheduled on Wednesday to obtain his sacral iliac injection.  According to patient, he is provided with a shot of Dilaudid every 3 months while in the ED.  Unfortunately, discussed that I am unable to do this at this time because this is a chronic complaint.  Patient is aware we are not a pain clinic and  if he has a contract with a pain clinic we are unable to violate this.  Patient became aggravated and stating "can you just look at my chart and do what they did last time ".  I offered patient pain medication such as Percocet x2 to help with his symptoms until his injection date.  Patient reports he is not refusing this medication as he is in a lot of pain.  As I feel for patient's chronic complaint of back pain unfortunately believe Dilaudid IM every 3 months in the ED is inappropriate use of the emergency department.  Patient does not have any new symptoms such as fevers, bowel or bladder dysfunction.  Patient provided with Percocet x2 to help with his symptoms.  Advised to follow-up with his pain management clinic as needed.  Patient discharged from the ED in stable condition.    Portions of this note were generated with Scientist, clinical (histocompatibility and immunogenetics)Dragon dictation software. Dictation errors may occur despite best attempts at proofreading.  Final Clinical Impressions(s) / ED Diagnoses   Final diagnoses:  Other chronic back pain    ED Discharge Orders    None       Claude MangesSoto, Daryan Cagley, PA-C 01/12/19 1545    Pricilla LovelessGoldston, Scott, MD 01/13/19 [phone]

## 2020-07-10 ENCOUNTER — Encounter (HOSPITAL_BASED_OUTPATIENT_CLINIC_OR_DEPARTMENT_OTHER): Payer: Self-pay | Admitting: Emergency Medicine

## 2020-07-10 ENCOUNTER — Other Ambulatory Visit: Payer: Self-pay

## 2020-07-10 ENCOUNTER — Emergency Department (HOSPITAL_BASED_OUTPATIENT_CLINIC_OR_DEPARTMENT_OTHER)
Admission: EM | Admit: 2020-07-10 | Discharge: 2020-07-10 | Disposition: A | Discharge status: Home / Self Care | Payer: No Typology Code available for payment source | Attending: Emergency Medicine | Admitting: Emergency Medicine

## 2020-07-10 DIAGNOSIS — G8929 Other chronic pain: Secondary | ICD-10-CM | POA: Diagnosis not present

## 2020-07-10 DIAGNOSIS — Z79899 Other long term (current) drug therapy: Secondary | ICD-10-CM | POA: Insufficient documentation

## 2020-07-10 DIAGNOSIS — M5441 Lumbago with sciatica, right side: Secondary | ICD-10-CM | POA: Diagnosis not present

## 2020-07-10 DIAGNOSIS — I10 Essential (primary) hypertension: Secondary | ICD-10-CM | POA: Insufficient documentation

## 2020-07-10 DIAGNOSIS — M545 Low back pain, unspecified: Secondary | ICD-10-CM | POA: Diagnosis present

## 2020-07-10 MED ORDER — MELOXICAM 15 MG PO TABS: 15.0000 mg | ORAL_TABLET | Freq: Every day | ORAL | 0 refills | Status: AC

## 2020-07-10 MED ORDER — HYDROMORPHONE HCL 1 MG/ML IJ SOLN
1.0000 mg | Freq: Once | INTRAMUSCULAR | Status: AC
  Administered 2020-07-10: 1 mg via INTRAMUSCULAR
  Filled 2020-07-10: qty 1

## 2020-07-10 MED ORDER — METHOCARBAMOL 500 MG PO TABS: 500.0000 mg | ORAL_TABLET | Freq: Two times a day (BID) | ORAL | 0 refills | Status: DC

## 2020-07-10 MED ORDER — ONDANSETRON 4 MG PO TBDP
4.0000 mg | ORAL_TABLET | Freq: Once | ORAL | Status: DC
  Filled 2020-07-10: qty 1

## 2020-07-10 NOTE — Discharge Instructions (Signed)
Take Meloxicam and Robaxin as prescribed for pain. Follow up with your pain management provider.

## 2020-07-10 NOTE — ED Provider Notes (Signed)
[facility] EMERGENCY DEPARTMENT Provider Note   CSN: [id number] Arrival date & time: 07/10/20  1124     History Chief Complaint  Patient presents with  . Back Pain    Alex [name] is a 55 y.o. male.  55 year old male presents with complaint of chronic low back pain.  Patient states that he had an epidural injection 2 days ago for sciatica on the right side which has not helped.  Patient states that he spoke with his pain management provider who states that he only gave 1 injection instead of 2 which is why this has not given him complete pain relief and patient is now scheduled to return on May 4 for another procedure.  Patient plans to instead go to his prior provider who is now practicing in [place] for his treatment.  In the meantime, patient does not have any pain medication to take and was told to come to the emergency room for an injection of Dilaudid.  Patient denies falls, injuries, fevers, any changes in his chronic baseline pain.  No other complaints or concerns today.        Past Medical History:  Diagnosis Date  . Chronic back pain   . Hypertension   . Migraine   . PTSD (post-traumatic stress disorder)     Patient Active Problem List   Diagnosis Date Noted  . HEMORRHOIDS-INTERNAL 08/18/2009  . BLOOD IN STOOL-MELENA 08/18/2009  . PERSONAL HX COLONIC POLYPS 08/18/2009  . RECTAL BLEEDING 05/28/2009  . OSTEOARTHRITIS 05/28/2009  . DEPRESSION 05/22/2009  . HYPERTENSION 05/22/2009  . LOW BACK PAIN 05/22/2009  . HEADACHE 05/22/2009  . RECTAL BLEEDING, HX OF 05/22/2009    Past Surgical History:  Procedure Laterality Date  . BACK SURGERY         No family history on file.  Social History   Tobacco Use  . Smoking status: Never Smoker  . Smokeless tobacco: Never Used  Substance Use Topics  . Alcohol use: Yes    Comment: occ  . Drug use: No    Home Medications Prior to Admission medications   Medication Sig Start Date End Date Taking?  Authorizing Provider  meloxicam (MOBIC) 15 MG tablet Take 1 tablet (15 mg total) by mouth daily for 10 days. 07/10/20 07/20/20 Yes Jeannie Fend, PA-C  methocarbamol (ROBAXIN) 500 MG tablet Take 1 tablet (500 mg total) by mouth 2 (two) times daily. 07/10/20  Yes Jeannie Fend, PA-C  gabapentin (NEURONTIN) 300 MG capsule Take 1 capsule (300 mg total) by mouth 3 (three) times daily. 03/14/18   Benjiman Core, MD  hydrochlorothiazide (HYDRODIURIL) 12.5 MG tablet Take 12.5 mg by mouth daily.    [provider]  lisinopril (PRINIVIL,ZESTRIL) 2.5 MG tablet Take 2.5 mg by mouth daily.    [provider]  nabumetone (RELAFEN) 500 MG tablet Take by mouth.    [provider]  venlafaxine (EFFEXOR) 100 MG tablet Take 250 mg by mouth once.    [provider]    Allergies    Trazodone and nefazodone  Review of Systems   Review of Systems  Constitutional: Negative for fever.  Gastrointestinal: Negative for abdominal pain.  Genitourinary: Negative for difficulty urinating.  Musculoskeletal: Positive for back pain. Negative for gait problem.  Skin: Negative for rash and wound.  Allergic/Immunologic: Negative for immunocompromised state.  Neurological: Negative for weakness and numbness.  Psychiatric/Behavioral: Negative for confusion.    Physical Exam Updated Vital Signs BP (!) 163/98 (BP Location: Right Arm)  Pulse 79   Temp 98.4 F (36.9 C)   Resp 18   Ht 5\' 7"  (1.702 m)   Wt 81.6 kg   SpO2 100%   BMI 28.19 kg/m   Physical Exam Vitals and nursing note reviewed.  Constitutional:      General: He is not in acute distress.    Appearance: He is well-developed. He is not diaphoretic.  HENT:     Head: Normocephalic and atraumatic.  Cardiovascular:     Pulses: Normal pulses.  Pulmonary:     Effort: Pulmonary effort is normal.  Abdominal:     Palpations: Abdomen is soft.     Tenderness: There is no abdominal tenderness.  Musculoskeletal:         General: No swelling, tenderness or deformity.     Lumbar back: No tenderness or bony tenderness. Negative right straight leg raise test and negative left straight leg raise test.  Skin:    General: Skin is warm and dry.     Findings: No erythema or rash.  Neurological:     Mental Status: He is alert and oriented to person, place, and time.     Sensory: No sensory deficit.     Motor: No weakness.     Gait: Gait normal.  Psychiatric:        Behavior: Behavior normal.     ED Results / Procedures / Treatments   Labs (all labs ordered are listed, but only abnormal results are displayed) Labs Reviewed - No data to display  EKG None  Radiology No results found.  Procedures Procedures   Medications Ordered in ED Medications  ondansetron (ZOFRAN-ODT) disintegrating tablet 4 mg (4 mg Oral Patient Refused/Not Given 07/10/20 1154)  HYDROmorphone (DILAUDID) injection 1 mg (1 mg Intramuscular Given 07/10/20 1153)    ED Course  I have reviewed the triage vital signs and the nursing notes.  Pertinent labs & imaging results that were available during my care of the patient were reviewed by me and considered in my medical decision making (see chart for details).  Clinical Course as of 07/10/20 1247  Fri Jul 10, 2020  [id number] 55 year old male with complaint of acute on chronic right low back pain.  No changes in chronic baseline pain, no recent falls or injuries or fevers.  On exam, does not have any pain reproduced with palpation, straight leg raise is negative, abdomen is soft and nontender and gait is intact.  Patient was given injection as discussed, no history of frequent ER visits for same.  Narcotics database reviewed, no recent prescriptions.  Patient was given prescriptions for Robaxin and meloxicam and advised to follow-up with his pain management specialist. [LM]    Clinical Course User Index [LM] 59   MDM Rules/Calculators/A&P                          Final  Clinical Impression(s) / ED Diagnoses Final diagnoses:  Chronic right-sided low back pain with right-sided sciatica    Rx / DC Orders ED Discharge Orders         Ordered    methocarbamol (ROBAXIN) 500 MG tablet  2 times daily        07/10/20 1147    meloxicam (MOBIC) 15 MG tablet  Daily        07/10/20 1147           07/12/20 07/10/20 1247    07/12/20, MD 07/11/20  1718  

## 2020-07-10 NOTE — ED Triage Notes (Signed)
Chronic lower back pain , epidural 2 days ago , no relief . Was told to come to Er for  " Dilaudid shot".  Goes to pain management clinic as well. Pain radiating to lower right leg.

## 2020-07-12 ENCOUNTER — Emergency Department (HOSPITAL_BASED_OUTPATIENT_CLINIC_OR_DEPARTMENT_OTHER)
Admission: EM | Admit: 2020-07-12 | Discharge: 2020-07-12 | Disposition: A | Discharge status: Home / Self Care | Payer: No Typology Code available for payment source | Attending: Emergency Medicine | Admitting: Emergency Medicine

## 2020-07-12 ENCOUNTER — Encounter (HOSPITAL_BASED_OUTPATIENT_CLINIC_OR_DEPARTMENT_OTHER): Payer: Self-pay | Admitting: Emergency Medicine

## 2020-07-12 ENCOUNTER — Other Ambulatory Visit: Payer: Self-pay

## 2020-07-12 DIAGNOSIS — M545 Low back pain, unspecified: Secondary | ICD-10-CM

## 2020-07-12 DIAGNOSIS — I10 Essential (primary) hypertension: Secondary | ICD-10-CM | POA: Diagnosis not present

## 2020-07-12 DIAGNOSIS — G8929 Other chronic pain: Secondary | ICD-10-CM | POA: Insufficient documentation

## 2020-07-12 DIAGNOSIS — Z79899 Other long term (current) drug therapy: Secondary | ICD-10-CM | POA: Diagnosis not present

## 2020-07-12 DIAGNOSIS — M549 Dorsalgia, unspecified: Secondary | ICD-10-CM | POA: Diagnosis present

## 2020-07-12 MED ORDER — OXYCODONE-ACETAMINOPHEN 10-325 MG PO TABS: 1.0000 | ORAL_TABLET | Freq: Four times a day (QID) | ORAL | 0 refills | Status: DC | PRN

## 2020-07-12 MED ORDER — HYDROMORPHONE HCL 1 MG/ML IJ SOLN
2.0000 mg | Freq: Once | INTRAMUSCULAR | Status: AC
  Administered 2020-07-12: 2 mg via INTRAMUSCULAR
  Filled 2020-07-12: qty 2

## 2020-07-12 MED ORDER — ONDANSETRON 4 MG PO TBDP
8.0000 mg | ORAL_TABLET | Freq: Once | ORAL | Status: AC
  Administered 2020-07-12: 8 mg via ORAL
  Filled 2020-07-12: qty 2

## 2020-07-12 MED ORDER — KETOROLAC TROMETHAMINE 30 MG/ML IJ SOLN
30.0000 mg | Freq: Once | INTRAMUSCULAR | Status: AC
  Administered 2020-07-12: 30 mg via INTRAMUSCULAR
  Filled 2020-07-12: qty 1

## 2020-07-12 NOTE — ED Provider Notes (Signed)
[facility] Provider Note: Alex Dell, MD, FACEP  CSN: [id number] MRN: [id number] ARRIVAL: 07/12/20 at 0435 ROOM: [facility]10/[facility]10   CHIEF COMPLAINT  Back Pain   HISTORY OF PRESENT ILLNESS  07/12/20 4:45 AM Alex [name] is a 55 y.o. male is a [employer] veteran with a history of chronic back pain.  He has had back surgery in the past.  On 07/01/2020 he underwent an attempted placement of a spinal stimulator that was unsuccessful.  He subsequently developed pain in his back radiating to his right leg that has gradually worsened.  It was intermittent but is now constant.  He subsequently underwent an attempted epidural that was unsuccessful.  He was seen in this ED 2 days ago where he was given a shot of Dilaudid and placed on meloxicam and Robaxin.  The Dilaudid gave him transient relief but the meloxicam and Robaxin have not helped.  He is now having severe pain (10 out of 10), worse with movement of his right hip.  He is still still able to move and feel his right leg.   Past Medical History:  Diagnosis Date  . Chronic back pain   . Hypertension   . Migraine   . PTSD (post-traumatic stress disorder)     Past Surgical History:  Procedure Laterality Date  . BACK SURGERY      No family history on file.  Social History   Tobacco Use  . Smoking status: Never Smoker  . Smokeless tobacco: Never Used  Substance Use Topics  . Alcohol use: Yes    Comment: occ  . Drug use: No    Prior to Admission medications   Medication Sig Start Date End Date Taking? Authorizing Provider  oxyCODONE-acetaminophen (PERCOCET) 10-325 MG tablet Take 1 tablet by mouth every 6 (six) hours as needed for pain. 07/12/20  Yes Aneli Zara, MD  gabapentin (NEURONTIN) 300 MG capsule Take 1 capsule (300 mg total) by mouth 3 (three) times daily. 03/14/18   Benjiman Core, MD  hydrochlorothiazide (HYDRODIURIL) 12.5 MG tablet Take 12.5 mg by mouth daily.    [provider]  lisinopril  (PRINIVIL,ZESTRIL) 2.5 MG tablet Take 2.5 mg by mouth daily.    [provider]  meloxicam (MOBIC) 15 MG tablet Take 1 tablet (15 mg total) by mouth daily for 10 days. 07/10/20 07/20/20  Jeannie Fend, PA-C  methocarbamol (ROBAXIN) 500 MG tablet Take 1 tablet (500 mg total) by mouth 2 (two) times daily. 07/10/20   Jeannie Fend, PA-C  nabumetone (RELAFEN) 500 MG tablet Take by mouth.    [provider]  venlafaxine (EFFEXOR) 100 MG tablet Take 250 mg by mouth once.    [provider]    Allergies Trazodone and nefazodone   REVIEW OF SYSTEMS  Negative except as noted here or in the History of Present Illness.   PHYSICAL EXAMINATION  Initial Vital Signs Blood pressure (!) 165/109, pulse 60, temperature 98.1 F (36.7 C), temperature source Oral, resp. rate 19, height 5\' 7"  (1.702 m), weight 81.6 kg, SpO2 100 %.  Examination General: Well-developed, well-nourished male in no acute distress; appearance consistent with age of record HENT: normocephalic; atraumatic Eyes: pupils equal, round and reactive to light; extraocular muscles intact Neck: supple Heart: regular rate and rhythm Lungs: clear to auscultation bilaterally Abdomen: soft; nondistended; nontender; bowel sounds present Back: Positive straight leg raise on the right 10 degrees Extremities: No deformity; full range of motion; pulses normal Neurologic: Awake, alert and oriented;  motor function intact in all extremities and symmetric; sensation intact and symmetric in lower extremities; no facial droop Skin: Warm and dry Psychiatric: Grimacing   RESULTS  Summary of this visit's results, reviewed and interpreted by myself:   EKG Interpretation  Date/Time:    Ventricular Rate:    PR Interval:    QRS Duration:   QT Interval:    QTC Calculation:   R Axis:     Text Interpretation:        Laboratory Studies: No results found for this or any previous visit (from the past 24 hour(s)). Imaging  Studies: No results found.  ED COURSE and MDM  Nursing notes, initial and subsequent vitals signs, including pulse oximetry, reviewed and interpreted by myself.  Vitals:   07/12/20 0444 07/12/20 0445  BP:  (!) 165/109  Pulse:  60  Resp:  19  Temp:  98.1 F (36.7 C)  TempSrc:  Oral  SpO2:  100%  Weight: 81.6 kg   Height: 5\' 7"  (1.702 m)    Medications  ketorolac (TORADOL) 30 MG/ML injection 30 mg (30 mg Intramuscular Given 07/12/20 0513)  HYDROmorphone (DILAUDID) injection 2 mg (2 mg Intramuscular Given 07/12/20 0513)  ondansetron (ZOFRAN-ODT) disintegrating tablet 8 mg (8 mg Oral Given 07/12/20 0522)   5:25 AM Pain improved after IM medications.  Patient plans to follow-up with his former pain physician in 07/14/20.  We will prescribe a brief course of narcotics in the meantime.  No signs of cauda equina syndrome at this time.   PROCEDURES  Procedures   ED DIAGNOSES     ICD-10-CM   1. Acute exacerbation of chronic low back pain  M54.50    G89.29        Lakeya Mulka, [place], MD 07/12/20 [phone]

## 2020-07-12 NOTE — ED Triage Notes (Signed)
BIB [facility] with recurrent lower back and R leg pain. Pt has been seen for same recently. Pt able to transfer himself from stretcher to bed, slowly. States Dilaudid and Toradol combined normally work. He is scheduled for a procedure soon.

## 2020-08-31 ENCOUNTER — Other Ambulatory Visit: Payer: Self-pay

## 2020-08-31 ENCOUNTER — Encounter (HOSPITAL_BASED_OUTPATIENT_CLINIC_OR_DEPARTMENT_OTHER): Payer: Self-pay | Admitting: *Deleted

## 2020-08-31 ENCOUNTER — Emergency Department (HOSPITAL_BASED_OUTPATIENT_CLINIC_OR_DEPARTMENT_OTHER)
Admission: EM | Admit: 2020-08-31 | Discharge: 2020-08-31 | Disposition: A | Discharge status: Home / Self Care | Payer: No Typology Code available for payment source | Attending: Emergency Medicine | Admitting: Emergency Medicine

## 2020-08-31 DIAGNOSIS — Z79899 Other long term (current) drug therapy: Secondary | ICD-10-CM | POA: Diagnosis not present

## 2020-08-31 DIAGNOSIS — U071 COVID-19: Secondary | ICD-10-CM | POA: Insufficient documentation

## 2020-08-31 DIAGNOSIS — J0100 Acute maxillary sinusitis, unspecified: Secondary | ICD-10-CM | POA: Insufficient documentation

## 2020-08-31 DIAGNOSIS — I10 Essential (primary) hypertension: Secondary | ICD-10-CM | POA: Insufficient documentation

## 2020-08-31 DIAGNOSIS — R0981 Nasal congestion: Secondary | ICD-10-CM | POA: Diagnosis present

## 2020-08-31 LAB — BASIC METABOLIC PANEL
Anion gap: 8 (ref 5–15)
BUN: 9 mg/dL (ref 6–20)
CO2: 25 mmol/L (ref 22–32)
Calcium: 8.5 mg/dL — ABNORMAL LOW (ref 8.9–10.3)
Chloride: 102 mmol/L (ref 98–111)
Creatinine, Ser: 1.21 mg/dL (ref 0.61–1.24)
GFR, Estimated: >60 mL/min (ref >60)
Glucose, Bld: 97 mg/dL (ref 70–99)
Potassium: 3.5 mmol/L (ref 3.5–5.1)
Sodium: 135 mmol/L (ref 135–145)

## 2020-08-31 MED ORDER — FLUTICASONE PROPIONATE 50 MCG/ACT NA SUSP: 1.0000 | Freq: Every day | NASAL | 2 refills | Status: DC

## 2020-08-31 MED ORDER — FLUTICASONE PROPIONATE 50 MCG/ACT NA SUSP: 1.0000 | Freq: Every day | NASAL | 2 refills | Status: AC

## 2020-08-31 MED ORDER — AMOXICILLIN-POT CLAVULANATE 875-125 MG PO TABS: 1.0000 | ORAL_TABLET | Freq: Two times a day (BID) | ORAL | 0 refills | Status: DC

## 2020-08-31 NOTE — ED Triage Notes (Signed)
Headache and chills. States he was recently treated for a sinus infection. He feels dehydrated.

## 2020-08-31 NOTE — Discharge Instructions (Addendum)
You have a COVID test that is pending.  You can check online or on the MyChart app regarding your result.  If your COVID test is negative, you can start the antibiotic and Flonase to help with your symptoms.  You can take Tylenol or Ibuprofen as directed for pain. You can alternate Tylenol and Ibuprofen every 4 hours. If you take Tylenol at 1pm, then you can take Ibuprofen at 5pm. Then you can take Tylenol again at 9pm.   Return to emergency department for any difficulty breathing, vomiting, abdominal pain, any other worsening concerning symptoms.

## 2020-08-31 NOTE — ED Provider Notes (Signed)
[facility] EMERGENCY DEPARTMENT Provider Note   CSN: [id number] Arrival date & time: 08/31/20  1314     History Chief Complaint  Patient presents with  . URI    Alex [name] is a 55 y.o. male with PMH/o HTN, Chronic back pain who presents for evaluation of sinus pressure, nasal congestion, rhinorrhea.  He states he initially had symptoms about 2 weeks ago.  They lasted for about 3 to 4 days and then he states that they resolved.  He did not get treated at that time.  He reports that about 4 to 5 days ago, symptoms returned and have continuously worsened.  He states that he feels sinus pressure in his face as well as nasal congestion, rhinorrhea.  He has also had some headaches, chills.  He has not measured any temperature.  He has had a slight cough but no difficulty breathing.  He has been vaccinated for COVID.  No COVID exposure that he knows of.  He denies any chest pain, abdominal pain, nausea/vomiting.  The history is provided by the patient.       Past Medical History:  Diagnosis Date  . Chronic back pain   . Hypertension   . Migraine   . PTSD (post-traumatic stress disorder)     Patient Active Problem List   Diagnosis Date Noted  . HEMORRHOIDS-INTERNAL 08/18/2009  . BLOOD IN STOOL-MELENA 08/18/2009  . PERSONAL HX COLONIC POLYPS 08/18/2009  . RECTAL BLEEDING 05/28/2009  . OSTEOARTHRITIS 05/28/2009  . DEPRESSION 05/22/2009  . HYPERTENSION 05/22/2009  . LOW BACK PAIN 05/22/2009  . HEADACHE 05/22/2009  . RECTAL BLEEDING, HX OF 05/22/2009    Past Surgical History:  Procedure Laterality Date  . BACK SURGERY         No family history on file.  Social History   Tobacco Use  . Smoking status: Never Smoker  . Smokeless tobacco: Never Used  Substance Use Topics  . Alcohol use: Yes    Comment: occ  . Drug use: No    Home Medications Prior to Admission medications   Medication Sig Start Date End Date Taking? Authorizing Provider   hydrochlorothiazide (HYDRODIURIL) 12.5 MG tablet Take 12.5 mg by mouth daily.   Yes [provider]  lisinopril (PRINIVIL,ZESTRIL) 2.5 MG tablet Take 2.5 mg by mouth daily.   Yes [provider]  venlafaxine (EFFEXOR) 100 MG tablet Take 250 mg by mouth once.   Yes [provider]  amoxicillin-clavulanate (AUGMENTIN) 875-125 MG tablet Take 1 tablet by mouth every 12 (twelve) hours. 08/31/20   Maxwell Caul, PA-C  fluticasone (FLONASE) 50 MCG/ACT nasal spray Place 1 spray into both nostrils daily. 08/31/20   Maxwell Caul, PA-C  gabapentin (NEURONTIN) 300 MG capsule Take 1 capsule (300 mg total) by mouth 3 (three) times daily. 03/14/18   Benjiman Core, MD  methocarbamol (ROBAXIN) 500 MG tablet Take 1 tablet (500 mg total) by mouth 2 (two) times daily. 07/10/20   Jeannie Fend, PA-C  nabumetone (RELAFEN) 500 MG tablet Take by mouth.    [provider]  oxyCODONE-acetaminophen (PERCOCET) 10-325 MG tablet Take 1 tablet by mouth every 6 (six) hours as needed for pain. 07/12/20   Molpus, John, MD    Allergies    Trazodone and nefazodone  Review of Systems   Review of Systems  Constitutional: Positive for chills. Negative for fever.  HENT: Positive for congestion, rhinorrhea, sinus pressure and sinus pain.   Respiratory: Positive for cough. Negative for shortness  of breath.   Cardiovascular: Negative for chest pain.  Gastrointestinal: Negative for abdominal pain, nausea and vomiting.  Genitourinary: Negative for hematuria.  Neurological: Negative for headaches.  All other systems reviewed and are negative.   Physical Exam Updated Vital Signs BP (!) 158/104 (BP Location: Left Arm)   Pulse 82   Temp 98.2 F (36.8 C)   Resp 18   Ht 5\' 7"  (1.702 m)   Wt 81.6 kg   SpO2 97%   BMI 28.18 kg/m   Physical Exam Vitals and nursing note reviewed.  Constitutional:      Appearance: Normal appearance. He is well-developed.  HENT:     Head:  Normocephalic and atraumatic.     Nose: Congestion present.     Right Turbinates: Enlarged.     Left Turbinates: Enlarged.     Right Sinus: Maxillary sinus tenderness present.     Left Sinus: Maxillary sinus tenderness present.     Comments: Bilateral nasal turbinates are edematous, erythematous and nasal congestion noted.  Patient with bilateral maxillary sinus pressure.    Mouth/Throat:     Comments: Posterior oropharynx is clear without any signs of erythema, edema. Eyes:     General: Lids are normal.     Conjunctiva/sclera: Conjunctivae normal.     Pupils: Pupils are equal, round, and reactive to light.  Cardiovascular:     Rate and Rhythm: Normal rate and regular rhythm.     Pulses: Normal pulses.     Heart sounds: Normal heart sounds. No murmur heard. No friction rub. No gallop.   Pulmonary:     Effort: Pulmonary effort is normal.     Breath sounds: Normal breath sounds.     Comments: Lungs clear to auscultation bilaterally.  No evidence of respiratory distress.  Able speak in full sentences without any difficulty. Abdominal:     Palpations: Abdomen is soft. Abdomen is not rigid.     Tenderness: There is no abdominal tenderness. There is no guarding.  Musculoskeletal:        General: Normal range of motion.     Cervical back: Full passive range of motion without pain.  Skin:    General: Skin is warm and dry.     Capillary Refill: Capillary refill takes less than 2 seconds.  Neurological:     Mental Status: He is alert and oriented to person, place, and time.  Psychiatric:        Speech: Speech normal.     ED Results / Procedures / Treatments   Labs (all labs ordered are listed, but only abnormal results are displayed) Labs Reviewed  BASIC METABOLIC PANEL - Abnormal; Notable for the following components:      Result Value   Calcium 8.5 (*)    All other components within normal limits  SARS CORONAVIRUS 2 (TAT 6-24 HRS)    EKG None  Radiology No results  found.  Procedures Procedures   Medications Ordered in ED Medications - No data to display  ED Course  I have reviewed the triage vital signs and the nursing notes.  Pertinent labs & imaging results that were available during my care of the patient were reviewed by me and considered in my medical decision making (see chart for details).    MDM Rules/Calculators/A&P                          55 year old male who presents for evaluation of nasal congestion, rhinorrhea.  States  symptoms initially started about 2 weeks ago, had a brief reprieve of symptoms in the return.  No fevers at home but has had some chills.  No difficulty breathing.  On initial arrival, he is afebrile nontoxic-appearing.  Vital signs are stable.  On exam, he does have edematous, erythematous nasal turbinates bilaterally with congestion, maxillary sinus pressure.  Patient thinks he may be dehydrated.  Labs ordered at triage.  No clinical signs of dehydration noted on my exam.  BMP shows normal BUN and creatinine.  COVID test has been sent.  Discussed results with patient.  Discussed with him regarding the COVID test that is pending.  Given that his symptoms have been ongoing for 2 weeks, we will plan to treat.  Patient with known drug allergies. At this time, patient exhibits no emergent life-threatening condition that require further evaluation in ED. Patient had ample opportunity for questions and discussion. All patient's questions were answered with full understanding. Strict return precautions discussed. Patient expresses understanding and agreement to plan.   Alex [name] was evaluated in Emergency Department on 08/31/2020 for the symptoms described in the history of present illness. He was evaluated in the context of the global COVID-19 pandemic, which necessitated consideration that the patient might be at risk for infection with the SARS-CoV-2 virus that causes COVID-19. Institutional protocols and algorithms that  pertain to the evaluation of patients at risk for COVID-19 are in a state of rapid change based on information released by regulatory bodies including the CDC and federal and state organizations. These policies and algorithms were followed during the patient's care in the ED.  Final Clinical Impression(s) / ED Diagnoses Final diagnoses:  Acute maxillary sinusitis, recurrence not specified    Rx / DC Orders ED Discharge Orders         Ordered    amoxicillin-clavulanate (AUGMENTIN) 875-125 MG tablet  Every 12 hours,   Status:  Discontinued        08/31/20 1829    fluticasone (FLONASE) 50 MCG/ACT nasal spray  Daily,   Status:  Discontinued        08/31/20 1829    amoxicillin-clavulanate (AUGMENTIN) 875-125 MG tablet  Every 12 hours        08/31/20 1835    fluticasone (FLONASE) 50 MCG/ACT nasal spray  Daily        08/31/20 1835           Rosana Hoes 08/31/20 1849    Koleen Distance, MD 08/31/20 2324

## 2020-09-01 LAB — SARS CORONAVIRUS 2 (TAT 6-24 HRS): SARS Coronavirus 2: POSITIVE — AB

## 2020-09-05 ENCOUNTER — Other Ambulatory Visit: Payer: Self-pay

## 2020-09-05 ENCOUNTER — Encounter (HOSPITAL_BASED_OUTPATIENT_CLINIC_OR_DEPARTMENT_OTHER): Payer: Self-pay | Admitting: *Deleted

## 2020-09-05 ENCOUNTER — Emergency Department (HOSPITAL_BASED_OUTPATIENT_CLINIC_OR_DEPARTMENT_OTHER)
Admission: EM | Admit: 2020-09-05 | Discharge: 2020-09-05 | Disposition: A | Discharge status: Home / Self Care | Payer: No Typology Code available for payment source | Attending: Emergency Medicine | Admitting: Emergency Medicine

## 2020-09-05 ENCOUNTER — Emergency Department (HOSPITAL_BASED_OUTPATIENT_CLINIC_OR_DEPARTMENT_OTHER): Payer: No Typology Code available for payment source

## 2020-09-05 DIAGNOSIS — R059 Cough, unspecified: Secondary | ICD-10-CM

## 2020-09-05 DIAGNOSIS — U071 COVID-19: Secondary | ICD-10-CM | POA: Insufficient documentation

## 2020-09-05 DIAGNOSIS — R0981 Nasal congestion: Secondary | ICD-10-CM | POA: Diagnosis present

## 2020-09-05 LAB — BASIC METABOLIC PANEL
Anion gap: 7 (ref 5–15)
BUN: 8 mg/dL (ref 6–20)
CO2: 26 mmol/L (ref 22–32)
Calcium: 8.5 mg/dL — ABNORMAL LOW (ref 8.9–10.3)
Chloride: 106 mmol/L (ref 98–111)
Creatinine, Ser: 1.01 mg/dL (ref 0.61–1.24)
GFR, Estimated: >60 mL/min (ref >60)
Glucose, Bld: 83 mg/dL (ref 70–99)
Potassium: 3.3 mmol/L — ABNORMAL LOW (ref 3.5–5.1)
Sodium: 139 mmol/L (ref 135–145)

## 2020-09-05 LAB — CBC WITH DIFFERENTIAL/PLATELET
Abs Immature Granulocytes: 0.1 10*3/uL — ABNORMAL HIGH (ref 0.00–0.07)
Basophils Absolute: 0 10*3/uL (ref 0.0–0.1)
Basophils Relative: 0 %
Eosinophils Absolute: 0.2 10*3/uL (ref 0.0–0.5)
Eosinophils Relative: 3 %
HCT: 35.5 % — ABNORMAL LOW (ref 39.0–52.0)
Hemoglobin: 12.2 g/dL — ABNORMAL LOW (ref 13.0–17.0)
Immature Granulocytes: 2 %
Lymphocytes Relative: 37 %
Lymphs Abs: 2.5 10*3/uL (ref 0.7–4.0)
MCH: 29.5 pg (ref 26.0–34.0)
MCHC: 34.4 g/dL (ref 30.0–36.0)
MCV: 86 fL (ref 80.0–100.0)
Monocytes Absolute: 0.8 10*3/uL (ref 0.1–1.0)
Monocytes Relative: 12 %
Neutro Abs: 3.1 10*3/uL (ref 1.7–7.7)
Neutrophils Relative %: 46 %
Platelets: 226 10*3/uL (ref 150–400)
RBC: 4.13 MIL/uL — ABNORMAL LOW (ref 4.22–5.81)
RDW: 13.2 % (ref 11.5–15.5)
WBC: 6.7 10*3/uL (ref 4.0–10.5)
nRBC: 0 % (ref 0.0–0.2)

## 2020-09-05 MED ORDER — PREDNISONE 50 MG PO TABS
60.0000 mg | ORAL_TABLET | Freq: Once | ORAL | Status: AC
  Administered 2020-09-05: 60 mg via ORAL
  Filled 2020-09-05: qty 1

## 2020-09-05 MED ORDER — PREDNISONE 20 MG PO TABS: ORAL_TABLET | ORAL | 0 refills | Status: DC

## 2020-09-05 MED ORDER — LACTATED RINGERS IV BOLUS
1000.0000 mL | Freq: Once | INTRAVENOUS | Status: AC
  Administered 2020-09-05: 1000 mL via INTRAVENOUS

## 2020-09-05 MED ORDER — BENZONATATE 100 MG PO CAPS: 100.0000 mg | ORAL_CAPSULE | Freq: Three times a day (TID) | ORAL | 0 refills | Status: DC | PRN

## 2020-09-05 MED ORDER — BENZONATATE 100 MG PO CAPS
100.0000 mg | ORAL_CAPSULE | Freq: Once | ORAL | Status: AC
  Administered 2020-09-05: 100 mg via ORAL
  Filled 2020-09-05: qty 1

## 2020-09-05 MED ORDER — ALBUTEROL SULFATE HFA 108 (90 BASE) MCG/ACT IN AERS
2.0000 | INHALATION_SPRAY | Freq: Once | RESPIRATORY_TRACT | Status: AC
  Administered 2020-09-05: 2 via RESPIRATORY_TRACT
  Filled 2020-09-05: qty 6.7

## 2020-09-05 NOTE — ED Triage Notes (Signed)
Pt reports feeling bad x 1 week- had covid test here Monday which was +. Reports feeling worse, chills, cough, pain in chest (states ? r/t coughing), fatigue, headache. States he has been taking antibiotics for sinus infection. He took 4 full strength aspirin today around 1pm
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
## 2020-09-05 NOTE — ED Notes (Signed)
AMA process explained and MSE waiver signed by patient. Dr. Clayborne Dana at bedside

## 2021-02-08 ENCOUNTER — Encounter (HOSPITAL_BASED_OUTPATIENT_CLINIC_OR_DEPARTMENT_OTHER): Payer: Self-pay | Admitting: Emergency Medicine

## 2021-02-08 ENCOUNTER — Emergency Department (HOSPITAL_BASED_OUTPATIENT_CLINIC_OR_DEPARTMENT_OTHER)
Admission: EM | Admit: 2021-02-08 | Discharge: 2021-02-08 | Disposition: A | Discharge status: Home / Self Care | Payer: No Typology Code available for payment source | Attending: Emergency Medicine | Admitting: Emergency Medicine

## 2021-02-08 ENCOUNTER — Other Ambulatory Visit: Payer: Self-pay

## 2021-02-08 DIAGNOSIS — M5432 Sciatica, left side: Secondary | ICD-10-CM | POA: Diagnosis not present

## 2021-02-08 DIAGNOSIS — Z79899 Other long term (current) drug therapy: Secondary | ICD-10-CM | POA: Insufficient documentation

## 2021-02-08 DIAGNOSIS — M549 Dorsalgia, unspecified: Secondary | ICD-10-CM | POA: Diagnosis present

## 2021-02-08 DIAGNOSIS — M5431 Sciatica, right side: Secondary | ICD-10-CM | POA: Diagnosis not present

## 2021-02-08 DIAGNOSIS — I10 Essential (primary) hypertension: Secondary | ICD-10-CM | POA: Insufficient documentation

## 2021-02-08 DIAGNOSIS — M5430 Sciatica, unspecified side: Secondary | ICD-10-CM

## 2021-02-08 MED ORDER — OXYCODONE-ACETAMINOPHEN 5-325 MG PO TABS
1.0000 | ORAL_TABLET | Freq: Once | ORAL | Status: AC
  Administered 2021-02-08: 1 via ORAL
  Filled 2021-02-08: qty 1

## 2021-02-08 MED ORDER — DEXAMETHASONE SODIUM PHOSPHATE 10 MG/ML IJ SOLN
10.0000 mg | Freq: Once | INTRAMUSCULAR | Status: DC
  Filled 2021-02-08: qty 1

## 2021-02-08 MED ORDER — LIDOCAINE 5 % EX PTCH: 1.0000 | MEDICATED_PATCH | CUTANEOUS | 0 refills | Status: AC

## 2021-02-08 MED ORDER — PREDNISONE 20 MG PO TABS: ORAL_TABLET | ORAL | 0 refills | Status: DC

## 2021-02-08 MED ORDER — LIDOCAINE 5 % EX PTCH
1.0000 | MEDICATED_PATCH | CUTANEOUS | Status: DC
  Filled 2021-02-08: qty 1

## 2021-02-08 MED ORDER — KETOROLAC TROMETHAMINE 60 MG/2ML IM SOLN
60.0000 mg | Freq: Once | INTRAMUSCULAR | Status: AC
  Administered 2021-02-08: 60 mg via INTRAMUSCULAR
  Filled 2021-02-08: qty 2

## 2021-02-08 MED ORDER — METHOCARBAMOL 500 MG PO TABS
500.0000 mg | ORAL_TABLET | Freq: Once | ORAL | Status: DC
  Filled 2021-02-08: qty 1

## 2021-02-08 NOTE — ED Triage Notes (Signed)
Pt presents with acute on chronic back pain. States he has a Midwife in [facility] that is planning to operate "in a week or so" but that he needs relief until then. Pt is ambulatory with a slow steady gait to tx room. Pt declined wheelchair. Pt states he has tried "ice and aspirin" with no relief.

## 2021-02-08 NOTE — ED Notes (Signed)
Pt refused medication. Reports only toradol and dilaudid helps his pain. Dr. Nicanor Alcon notified

## 2021-02-08 NOTE — ED Provider Notes (Signed)
[facility] EMERGENCY DEPARTMENT Provider Note   CSN: [id number] Arrival date & time: 02/08/21  0458     History Chief Complaint  Patient presents with   Back Pain    Alex [name] is a 55 y.o. male.  The history is provided by the patient.  Back Pain Location:  Sacro-iliac joint Quality:  Shooting Radiates to:  L posterior upper leg and R posterior upper leg Pain severity:  Severe Pain is:  Same all the time Onset quality:  Gradual Duration: years. Timing:  Constant Progression:  Unchanged Chronicity:  Chronic Context: not MVA   Relieved by:  Nothing Worsened by:  Nothing Ineffective treatments:  None tried Associated symptoms: no abdominal pain, no fever, no perianal numbness and no weakness   Risk factors: no hx of cancer and no recent surgery   Patient reports he sees a pain management specialist in [place] and has been in pain for years.  No change, no new trauma    Past Medical History:  Diagnosis Date   Chronic back pain    Hypertension    Migraine    PTSD (post-traumatic stress disorder)     Patient Active Problem List   Diagnosis Date Noted   HEMORRHOIDS-INTERNAL 08/18/2009   BLOOD IN STOOL-MELENA 08/18/2009   PERSONAL HX COLONIC POLYPS 08/18/2009   RECTAL BLEEDING 05/28/2009   OSTEOARTHRITIS 05/28/2009   DEPRESSION 05/22/2009   HYPERTENSION 05/22/2009   LOW BACK PAIN 05/22/2009   HEADACHE 05/22/2009   RECTAL BLEEDING, HX OF 05/22/2009    Past Surgical History:  Procedure Laterality Date   BACK SURGERY         History reviewed. No pertinent family history.  Social History   Tobacco Use   Smoking status: Never   Smokeless tobacco: Never  Substance Use Topics   Alcohol use: Yes    Comment: occ   Drug use: No    Home Medications Prior to Admission medications   Medication Sig Start Date End Date Taking? Authorizing Provider  lidocaine (LIDODERM) 5 % Place 1 patch onto the skin daily. Remove & Discard patch within 12 hours  or as directed by MD 02/08/21  Yes Kecia Swoboda, MD  predniSONE (DELTASONE) 20 MG tablet 2 tabs po daily x 3 days 02/08/21  Yes Angeles Zehner, MD  amoxicillin-clavulanate (AUGMENTIN) 875-125 MG tablet Take 1 tablet by mouth every 12 (twelve) hours. 08/31/20   Maxwell Caul, PA-C  benzonatate (TESSALON) 100 MG capsule Take 1 capsule (100 mg total) by mouth 3 (three) times daily as needed for cough. 09/05/20   Mesner, Barbara Cower, MD  fluticasone (FLONASE) 50 MCG/ACT nasal spray Place 1 spray into both nostrils daily. 08/31/20   Maxwell Caul, PA-C  gabapentin (NEURONTIN) 300 MG capsule Take 1 capsule (300 mg total) by mouth 3 (three) times daily. 03/14/18   Benjiman Core, MD  hydrochlorothiazide (HYDRODIURIL) 12.5 MG tablet Take 12.5 mg by mouth daily.    [provider]  lisinopril (PRINIVIL,ZESTRIL) 2.5 MG tablet Take 2.5 mg by mouth daily.    [provider]  methocarbamol (ROBAXIN) 500 MG tablet Take 1 tablet (500 mg total) by mouth 2 (two) times daily. 07/10/20   Jeannie Fend, PA-C  nabumetone (RELAFEN) 500 MG tablet Take by mouth.    [provider]  oxyCODONE-acetaminophen (PERCOCET) 10-325 MG tablet Take 1 tablet by mouth every 6 (six) hours as needed for pain. 07/12/20   Molpus, John, MD  predniSONE (DELTASONE) 20 MG tablet 2 tabs po daily  x 4 days 09/05/20   Mesner, Barbara Cower, MD  SUMAtriptan (IMITREX) 6 MG/0.5ML SOSY injection Inject into the skin. 03/24/20   [provider]  venlafaxine (EFFEXOR) 100 MG tablet Take 250 mg by mouth once.    [provider]  zolpidem (AMBIEN) 10 MG tablet Take by mouth. 08/10/20   [provider]    Allergies    Trazodone and nefazodone  Review of Systems   Review of Systems  Constitutional:  Negative for fever.  Gastrointestinal:  Negative for abdominal pain.  Musculoskeletal:  Positive for back pain.  Neurological:  Negative for weakness.   Physical Exam Updated Vital Signs BP (!) 155/105  (BP Location: Right Arm)   Pulse 62   Temp 98.9 F (37.2 C) (Oral)   Resp 20   Ht 5\' 7"  (1.702 m)   Wt 81.6 kg   SpO2 98%   BMI 28.19 kg/m   Physical Exam  ED Results / Procedures / Treatments   Labs (all labs ordered are listed, but only abnormal results are displayed) Labs Reviewed - No data to display  EKG None  Radiology No results found.  Procedures Procedures   Medications Ordered in ED Medications  methocarbamol (ROBAXIN) tablet 500 mg (500 mg Oral Patient Refused/Not Given 02/08/21 0550)  lidocaine (LIDODERM) 5 % 1 patch (1 patch Transdermal Patient Refused/Not Given 02/08/21 0550)  dexamethasone (DECADRON) injection 10 mg (10 mg Intramuscular Patient Refused/Not Given 02/08/21 0550)  ketorolac (TORADOL) injection 60 mg (60 mg Intramuscular Given 02/08/21 0601)  oxyCODONE-acetaminophen (PERCOCET/ROXICET) 5-325 MG per tablet 1 tablet (1 tablet Oral Given 02/08/21 0601)    ED Course  I have reviewed the triage vital signs and the nursing notes.  Pertinent labs & imaging results that were available during my care of the patient were reviewed by me and considered in my medical decision making (see chart for details). Quick and steady gait to the room, patient sleeping upon EDP entrance to the room.  There are no non verbal cues of pain.     Pain medication was ordered by me for the patient immediately upon arrival.  Nurse reports patient has refused this medication.  EDP went to see another patient and patient is asking ot speak to EDP.  EDP immediately went to speak with patient.  Patient is stating nothing but dilaudid helps his pain as "that is the level I amat now."  EDP stated percocet and toradol and other medications have been ordered.  Patient states " I have tried that lidoderm and prednisone, it doesn't work. I want diladudid."  EDP politely stated I do not prescribe dilaudid for chronic pain.   I have ordered several medications for patient's comfort, but I  will not give IV or IM narcotics for chronic pain.  I believe this to be drug seeking.    Patient is stable for discharge and can follow up with pain management  Alex [name] was evaluated in Emergency Department on 02/08/2021 for the symptoms described in the history of present illness. He was evaluated in the context of the global COVID-19 pandemic, which necessitated consideration that the patient might be at risk for infection with the SARS-CoV-2 virus that causes COVID-19. Institutional protocols and algorithms that pertain to the evaluation of patients at risk for COVID-19 are in a state of rapid change based on information released by regulatory bodies including the CDC and federal and state organizations. These policies and algorithms were followed during the patient's care in the ED.  Final Clinical Impression(s) / ED Diagnoses Final diagnoses:  Sciatica, unspecified laterality   Return for intractable cough, coughing up blood, fevers > 100.4 unrelieved by medication, shortness of breath, intractable vomiting, chest pain, shortness of breath, weakness, numbness, changes in speech, facial asymmetry, abdominal pain, passing out, Inability to tolerate liquids or food, cough, altered mental status or any concerns. No signs of systemic illness or infection. The patient is nontoxic-appearing on exam and vital signs are within normal limits.  I have reviewed the triage vital signs and the nursing notes. Pertinent labs & imaging results that were available during my care of the patient were reviewed by me and considered in my medical decision making (see chart for details). After history, exam, and medical workup I feel the patient has been appropriately medically screened and is safe for discharge home. Pertinent diagnoses were discussed with the patient. Patient was given return precautions.  Rx / DC Orders ED Discharge Orders          Ordered    predniSONE (DELTASONE) 20 MG tablet        02/08/21  0553    lidocaine (LIDODERM) 5 %  Every 24 hours        02/08/21 0553             Damon Hargrove, MD 02/08/21 [phone]

## 2021-02-08 NOTE — ED Notes (Signed)
Pt refusing to take Toradol and percocet. Requesting to speak to Dr. Nicanor Alcon. Md notified

## 2021-02-08 NOTE — ED Notes (Signed)
Patient discharged at this time.  Refused to take prescription.  Reports I won't get it filled anyway.

## 2021-04-16 ENCOUNTER — Encounter (HOSPITAL_BASED_OUTPATIENT_CLINIC_OR_DEPARTMENT_OTHER): Payer: Self-pay | Admitting: Emergency Medicine

## 2021-04-16 ENCOUNTER — Emergency Department (HOSPITAL_BASED_OUTPATIENT_CLINIC_OR_DEPARTMENT_OTHER)
Admission: EM | Admit: 2021-04-16 | Discharge: 2021-04-16 | Disposition: A | Discharge status: Home / Self Care | Payer: No Typology Code available for payment source | Attending: Emergency Medicine | Admitting: Emergency Medicine

## 2021-04-16 ENCOUNTER — Emergency Department (HOSPITAL_BASED_OUTPATIENT_CLINIC_OR_DEPARTMENT_OTHER): Payer: No Typology Code available for payment source

## 2021-04-16 ENCOUNTER — Other Ambulatory Visit: Payer: Self-pay

## 2021-04-16 DIAGNOSIS — I10 Essential (primary) hypertension: Secondary | ICD-10-CM | POA: Insufficient documentation

## 2021-04-16 DIAGNOSIS — R072 Precordial pain: Secondary | ICD-10-CM | POA: Diagnosis present

## 2021-04-16 DIAGNOSIS — Z20822 Contact with and (suspected) exposure to covid-19: Secondary | ICD-10-CM | POA: Diagnosis not present

## 2021-04-16 DIAGNOSIS — R0789 Other chest pain: Secondary | ICD-10-CM

## 2021-04-16 DIAGNOSIS — Z79899 Other long term (current) drug therapy: Secondary | ICD-10-CM | POA: Insufficient documentation

## 2021-04-16 LAB — BASIC METABOLIC PANEL
Anion gap: 8 (ref 5–15)
BUN: 16 mg/dL (ref 6–20)
CO2: 22 mmol/L (ref 22–32)
Calcium: 8.4 mg/dL — ABNORMAL LOW (ref 8.9–10.3)
Chloride: 104 mmol/L (ref 98–111)
Creatinine, Ser: 1.16 mg/dL (ref 0.61–1.24)
GFR, Estimated: >60 mL/min (ref >60)
Glucose, Bld: 110 mg/dL — ABNORMAL HIGH (ref 70–99)
Potassium: 3.5 mmol/L (ref 3.5–5.1)
Sodium: 134 mmol/L — ABNORMAL LOW (ref 135–145)

## 2021-04-16 LAB — CBC WITH DIFFERENTIAL/PLATELET
Abs Immature Granulocytes: 0.01 10*3/uL (ref 0.00–0.07)
Basophils Absolute: 0 10*3/uL (ref 0.0–0.1)
Basophils Relative: 0 %
Eosinophils Absolute: 0.4 10*3/uL (ref 0.0–0.5)
Eosinophils Relative: 5 %
HCT: 33.4 % — ABNORMAL LOW (ref 39.0–52.0)
Hemoglobin: 11 g/dL — ABNORMAL LOW (ref 13.0–17.0)
Immature Granulocytes: 0 %
Lymphocytes Relative: 41 %
Lymphs Abs: 2.9 10*3/uL (ref 0.7–4.0)
MCH: 24.7 pg — ABNORMAL LOW (ref 26.0–34.0)
MCHC: 32.9 g/dL (ref 30.0–36.0)
MCV: 74.9 fL — ABNORMAL LOW (ref 80.0–100.0)
Monocytes Absolute: 0.8 10*3/uL (ref 0.1–1.0)
Monocytes Relative: 12 %
Neutro Abs: 3 10*3/uL (ref 1.7–7.7)
Neutrophils Relative %: 42 %
Platelets: 237 10*3/uL (ref 150–400)
RBC: 4.46 MIL/uL (ref 4.22–5.81)
RDW: 15.4 % (ref 11.5–15.5)
WBC: 7.1 10*3/uL (ref 4.0–10.5)
nRBC: 0 % (ref 0.0–0.2)

## 2021-04-16 LAB — RESP PANEL BY RT-PCR (FLU A&B, COVID) ARPGX2
Influenza A by PCR: NEGATIVE
Influenza B by PCR: NEGATIVE
SARS Coronavirus 2 by RT PCR: NEGATIVE

## 2021-04-16 LAB — TROPONIN I (HIGH SENSITIVITY): Troponin I (High Sensitivity): <2 ng/L (ref <18)

## 2021-04-16 MED ORDER — KETOROLAC TROMETHAMINE 15 MG/ML IJ SOLN
15.0000 mg | Freq: Once | INTRAMUSCULAR | Status: AC
  Administered 2021-04-16: 04:00:00 15 mg via INTRAVENOUS
  Filled 2021-04-16: qty 1

## 2021-04-16 MED ORDER — NAPROXEN 500 MG PO TABS: ORAL_TABLET | ORAL | 0 refills | Status: AC

## 2021-04-16 NOTE — ED Provider Notes (Signed)
[facility] Provider Note: Alex Dell, MD, FACEP  CSN: [id number] MRN: [id number] ARRIVAL: 04/16/21 at 0229 ROOM: [facility]   CHIEF COMPLAINT  Chest Pain   HISTORY OF PRESENT ILLNESS  04/16/21 3:34 AM Alex [name] is a 55 y.o. male with chest pain since yesterday morning.  The pain began mildly and is gradually worsened.  The pain is located in his upper sternal area.  He rates it as a 10 out of 10.  It is worse with movement or palpation.  He denies any trauma or lifting recently.  He is having no shortness of breath, nausea, vomiting, diaphoresis or cough.  He has had a recent exposure to influenza.  He has taken Tylenol for this without relief.   Past Medical History:  Diagnosis Date   Chronic back pain    Hypertension    Migraine    PTSD (post-traumatic stress disorder)     Past Surgical History:  Procedure Laterality Date   BACK SURGERY      History reviewed. No pertinent family history.  Social History   Tobacco Use   Smoking status: Never   Smokeless tobacco: Never  Substance Use Topics   Alcohol use: Yes    Comment: occ   Drug use: No    Prior to Admission medications   Medication Sig Start Date End Date Taking? Authorizing Provider  naproxen (NAPROSYN) 500 MG tablet Take 1 tablet twice daily as needed for chest wall pain. 04/16/21  Yes Tyee Vandevoorde, MD  fluticasone (FLONASE) 50 MCG/ACT nasal spray Place 1 spray into both nostrils daily. 08/31/20   Maxwell Caul, PA-C  hydrochlorothiazide (HYDRODIURIL) 12.5 MG tablet Take 12.5 mg by mouth daily.    [provider]  lidocaine (LIDODERM) 5 % Place 1 patch onto the skin daily. Remove & Discard patch within 12 hours or as directed by MD 02/08/21   Palumbo, April, MD  lisinopril (PRINIVIL,ZESTRIL) 2.5 MG tablet Take 2.5 mg by mouth daily.    [provider]  SUMAtriptan (IMITREX) 6 MG/0.5ML SOSY injection Inject into the skin. 03/24/20   [provider]   venlafaxine (EFFEXOR) 100 MG tablet Take 250 mg by mouth once.    [provider]  zolpidem (AMBIEN) 10 MG tablet Take by mouth. 08/10/20   [provider]    Allergies Trazodone and nefazodone   REVIEW OF SYSTEMS  Negative except as noted here or in the History of Present Illness.   PHYSICAL EXAMINATION  Initial Vital Signs Blood pressure (!) 169/105, pulse (!) 56, temperature 98.6 F (37 C), temperature source Oral, resp. rate 16, height 5\' 7"  (1.702 m), weight 81.6 kg, SpO2 99 %.  Examination General: Well-developed, well-nourished male in no acute distress; appearance consistent with age of record HENT: normocephalic; atraumatic Eyes: Normal appearance Neck: supple Heart: regular rate and rhythm Lungs: clear to auscultation bilaterally Chest: Upper sternal tenderness without deformity, erythema or warmth Abdomen: soft; nondistended; nontender; bowel sounds present Extremities: No deformity; full range of motion; pulses normal Neurologic: Awake, alert and oriented; motor function intact in all extremities and symmetric; no facial droop Skin: Warm and dry Psychiatric: Normal mood and affect   RESULTS  Summary of this visit's results, reviewed and interpreted by myself:   EKG Interpretation  Date/Time:  Friday April 16 2021 02:36:32 EST Ventricular Rate:  62 PR Interval:  158 QRS Duration: 93 QT Interval:  407 QTC Calculation: 414 R Axis:   -23 Text Interpretation: Sinus rhythm Consider left  atrial enlargement Borderline left axis deviation Nonspecific T abnormalities, lateral leads Minimal ST elevation, anterior leads No significant change was found Confirmed by Paula Libra ([id number]) on 04/16/2021 2:45:07 AM       Laboratory Studies: Results for orders placed or performed during the hospital encounter of 04/16/21 (from the past 24 hour(s))  Basic metabolic panel     Status: Abnormal   Collection Time: 04/16/21  2:54 AM  Result Value Ref  Range   Sodium 134 (L) 135 - 145 mmol/L   Potassium 3.5 3.5 - 5.1 mmol/L   Chloride 104 98 - 111 mmol/L   CO2 22 22 - 32 mmol/L   Glucose, Bld 110 (H) 70 - 99 mg/dL   BUN 16 6 - 20 mg/dL   Creatinine, Ser 3.71 0.61 - 1.24 mg/dL   Calcium 8.4 (L) 8.9 - 10.3 mg/dL   GFR, Estimated >69 >67 mL/min   Anion gap 8 5 - 15  Troponin I (High Sensitivity)     Status: None   Collection Time: 04/16/21  2:54 AM  Result Value Ref Range   Troponin I (High Sensitivity) <2 <18 ng/L  CBC with Differential/Platelet     Status: Abnormal   Collection Time: 04/16/21  2:54 AM  Result Value Ref Range   WBC 7.1 4.0 - 10.5 K/uL   RBC 4.46 4.22 - 5.81 MIL/uL   Hemoglobin 11.0 (L) 13.0 - 17.0 g/dL   HCT 89.3 (L) 81.0 - 17.5 %   MCV 74.9 (L) 80.0 - 100.0 fL   MCH 24.7 (L) 26.0 - 34.0 pg   MCHC 32.9 30.0 - 36.0 g/dL   RDW 10.2 58.5 - 27.7 %   Platelets 237 150 - 400 K/uL   nRBC 0.0 0.0 - 0.2 %   Neutrophils Relative % 42 %   Neutro Abs 3.0 1.7 - 7.7 K/uL   Lymphocytes Relative 41 %   Lymphs Abs 2.9 0.7 - 4.0 K/uL   Monocytes Relative 12 %   Monocytes Absolute 0.8 0.1 - 1.0 K/uL   Eosinophils Relative 5 %   Eosinophils Absolute 0.4 0.0 - 0.5 K/uL   Basophils Relative 0 %   Basophils Absolute 0.0 0.0 - 0.1 K/uL   Immature Granulocytes 0 %   Abs Immature Granulocytes 0.01 0.00 - 0.07 K/uL  Resp Panel by RT-PCR (Flu A&B, Covid) Nasopharyngeal Swab     Status: None   Collection Time: 04/16/21  2:55 AM   Specimen: Nasopharyngeal Swab; Nasopharyngeal(NP) swabs in vial transport medium  Result Value Ref Range   SARS Coronavirus 2 by RT PCR NEGATIVE NEGATIVE   Influenza A by PCR NEGATIVE NEGATIVE   Influenza B by PCR NEGATIVE NEGATIVE   Imaging Studies: DG Chest 2 View  Result Date: 04/16/2021 CLINICAL DATA:  Central chest pain. EXAM: CHEST - 2 VIEW COMPARISON:  Sep 05, 2020 FINDINGS: Multiple radiopaque cardiac lead wires are seen overlying the left lung base. The heart size and mediastinal contours are  within normal limits. Both lungs are clear. The visualized skeletal structures are unremarkable. IMPRESSION: No active cardiopulmonary disease. Electronically Signed   By: Aram Candela M.D.   On: 04/16/2021 02:49    ED COURSE and MDM  Nursing notes, initial and subsequent vitals signs, including pulse oximetry, reviewed and interpreted by myself.  Vitals:   04/16/21 0234 04/16/21 0235 04/16/21 0238 04/16/21 0238  BP: (!) 169/105     Pulse: 64  (!) 56   Resp:   16   Temp:  98.6 F (37 C)  TempSrc:    Oral  SpO2: 99%  99%   Weight:  81.6 kg    Height:  5\' 7"  (1.702 m)     Medications  ketorolac (TORADOL) 15 MG/ML injection 15 mg (15 mg Intravenous Given 04/16/21 0353)   4:11 AM Significant relief with IV Toradol.  I suspect this represents inflammatory pain such as costochondritis.  No evidence of cardiac etiology at this time.    PROCEDURES  Procedures   ED DIAGNOSES     ICD-10-CM   1. Chest wall pain  R07.89          06-26-1988, MD 04/16/21 [phone]

## 2021-04-16 NOTE — ED Triage Notes (Signed)
Central cp since earlier today. Denies radiation, n/v, diaphoresis. Endorses exposure to the flu.

## 2021-04-16 NOTE — ED Notes (Signed)
Patient transported to X-ray 

## 2021-07-27 ENCOUNTER — Emergency Department (HOSPITAL_BASED_OUTPATIENT_CLINIC_OR_DEPARTMENT_OTHER)
Admission: EM | Admit: 2021-07-27 | Discharge: 2021-07-28 | Disposition: A | Discharge status: Home / Self Care | Payer: Medicare Other | Attending: Emergency Medicine | Admitting: Emergency Medicine

## 2021-07-27 ENCOUNTER — Encounter (HOSPITAL_BASED_OUTPATIENT_CLINIC_OR_DEPARTMENT_OTHER): Payer: Self-pay

## 2021-07-27 ENCOUNTER — Other Ambulatory Visit: Payer: Self-pay

## 2021-07-27 DIAGNOSIS — M545 Low back pain, unspecified: Secondary | ICD-10-CM | POA: Diagnosis present

## 2021-07-27 DIAGNOSIS — M5432 Sciatica, left side: Secondary | ICD-10-CM | POA: Diagnosis not present

## 2021-07-27 MED ORDER — METHOCARBAMOL 500 MG PO TABS
750.0000 mg | ORAL_TABLET | Freq: Once | ORAL | Status: AC
  Administered 2021-07-27: 750 mg via ORAL
  Filled 2021-07-27: qty 2

## 2021-07-27 MED ORDER — KETOROLAC TROMETHAMINE 30 MG/ML IJ SOLN
30.0000 mg | Freq: Once | INTRAMUSCULAR | Status: AC
  Administered 2021-07-27: 30 mg via INTRAMUSCULAR
  Filled 2021-07-27: qty 1

## 2021-07-27 MED ORDER — HYDROMORPHONE HCL 1 MG/ML IJ SOLN
1.0000 mg | Freq: Once | INTRAMUSCULAR | Status: AC
  Administered 2021-07-27: 1 mg via INTRAMUSCULAR
  Filled 2021-07-27: qty 1

## 2021-07-27 NOTE — ED Triage Notes (Signed)
Patient complains of L sided sciatica pain which began several days ago.  States last time he had to "get an injection."   ?

## 2021-07-27 NOTE — ED Provider Notes (Signed)
?[facility] EMERGENCY DEPARTMENT ?Provider Note ? ? ?CSN: [id number] ?Arrival date & time: 07/27/21  2118 ? ?  ? ?History ? ?Chief Complaint  ?Patient presents with  ? Back Pain  ? ? ?Alex [name] is a 56 y.o. male. ? ?Patient with left-sided lower back pain that radiates down his left leg consistent with previous episodes of sciatica.  States he has chronic pain and requires injections from time to time.  Previous back surgery 3 years ago.  Pain became severe today after a nap around 3 PM.  Denies fall or trauma.  Pain and numbness radiates to his left knee but does not go past the knee.  There is some weakness to his leg as well as numbness and tingling.  No bowel or bladder incontinence.  No fever.  No chest pain or shortness of breath.  No pain with urination.  No blood in the urine.  No fever.  No history of IV drug abuse or cancer.  No abdominal pain. ?He reports feels similar to previous episodes of sciatica and is requesting injection of Dilaudid and Toradol by name. ? ?The history is provided by the patient.  ?Back Pain ?Associated symptoms: no dysuria, no fever, no headaches and no weakness   ? ?  ? ?Home Medications ?Prior to Admission medications   ?Medication Sig Start Date End Date Taking? Authorizing Provider  ?fluticasone (FLONASE) 50 MCG/ACT nasal spray Place 1 spray into both nostrils daily. 08/31/20   Maxwell Caul, PA-C  ?hydrochlorothiazide (HYDRODIURIL) 12.5 MG tablet Take 12.5 mg by mouth daily.    [provider]  ?lidocaine (LIDODERM) 5 % Place 1 patch onto the skin daily. Remove & Discard patch within 12 hours or as directed by MD 02/08/21   Nicanor Alcon, April, MD  ?lisinopril (PRINIVIL,ZESTRIL) 2.5 MG tablet Take 2.5 mg by mouth daily.    [provider]  ?naproxen (NAPROSYN) 500 MG tablet Take 1 tablet twice daily as needed for chest wall pain. 04/16/21   Molpus, John, MD  ?SUMAtriptan (IMITREX) 6 MG/0.5ML SOSY injection Inject into the skin. 03/24/20   [provider]  ?venlafaxine (EFFEXOR) 100 MG tablet Take 250 mg by mouth once.    [provider]  ?zolpidem (AMBIEN) 10 MG tablet Take by mouth. 08/10/20   [provider]  ?   ? ?Allergies    ?Trazodone and nefazodone   ? ?Review of Systems   ?Review of Systems  ?Constitutional:  Negative for activity change, appetite change and fever.  ?HENT:  Negative for congestion and nosebleeds.   ?Respiratory:  Negative for cough, chest tightness and shortness of breath.   ?Gastrointestinal:  Negative for nausea and vomiting.  ?Genitourinary:  Negative for dysuria.  ?Musculoskeletal:  Positive for arthralgias, back pain and myalgias.  ?Skin:  Negative for rash.  ?Neurological:  Negative for dizziness, weakness and headaches.  ? all other systems are negative except as noted in the HPI and PMH.  ? ?Physical Exam ?Updated Vital Signs ?BP (!) 141/99 (BP Location: Right Arm)   Pulse 70   Temp 99 ?F (37.2 ?C) (Oral)   Resp 18   Ht 5\' 7"  (1.702 m)   Wt 81.6 kg   SpO2 98%   BMI 28.19 kg/m?  ?Physical Exam ?Vitals and nursing note reviewed.  ?Constitutional:   ?   General: He is not in acute distress. ?   Appearance: He is well-developed.  ?HENT:  ?   Head: Normocephalic and atraumatic.  ?  Mouth/Throat:  ?   Pharynx: No oropharyngeal exudate.  ?Eyes:  ?   Conjunctiva/sclera: Conjunctivae normal.  ?   Pupils: Pupils are equal, round, and reactive to light.  ?Neck:  ?   Comments: No meningismus. ?Cardiovascular:  ?   Rate and Rhythm: Normal rate and regular rhythm.  ?   Heart sounds: Normal heart sounds. No murmur heard. ?Pulmonary:  ?   Effort: Pulmonary effort is normal. No respiratory distress.  ?   Breath sounds: Normal breath sounds.  ?Abdominal:  ?   Palpations: Abdomen is soft.  ?   Tenderness: There is no abdominal tenderness. There is no guarding or rebound.  ?Musculoskeletal:     ?   General: Tenderness present. Normal range of motion.  ?   Cervical back: Normal range of motion and neck supple.  ?    Comments: L lower back tenderness ? ?5/5 strength in right lower extremity.  4/5 strength in left lower extremity secondary to pain.  Ankle plantar and dorsiflexion intact. Great toe extension intact bilaterally. +2 DP and PT pulses. +2 patellar reflexes bilaterally. Normal gait. ?  ?Skin: ?   General: Skin is warm.  ?Neurological:  ?   Mental Status: He is alert and oriented to person, place, and time.  ?   Cranial Nerves: No cranial nerve deficit.  ?   Motor: No abnormal muscle tone.  ?   Coordination: Coordination normal.  ?   Comments:  5/5 strength throughout. CN 2-12 intact.Equal grip strength.   ?Psychiatric:     ?   Behavior: Behavior normal.  ? ? ?ED Results / Procedures / Treatments   ?Labs ?(all labs ordered are listed, but only abnormal results are displayed) ?Labs Reviewed - No data to display ? ?EKG ?None ? ?Radiology ?No results found. ? ?Procedures ?Procedures  ? ? ?Medications Ordered in ED ?Medications  ?HYDROmorphone (DILAUDID) injection 1 mg (has no administration in time range)  ?ketorolac (TORADOL) 30 MG/ML injection 30 mg (has no administration in time range)  ?methocarbamol (ROBAXIN) tablet 750 mg (has no administration in time range)  ? ? ?ED Course/ Medical Decision Making/ A&P ?  ?                        ?Medical Decision Making ?Risk ?Prescription drug management. ? ?Left-sided low back pain similar to previous episodes of sciatica.  He does have some weakness secondary to pain but good strength on provocative testing.  Intact distal pulses, sensation and reflexes.  Low suspicion for cord compression or cauda equina. ? ?Patient feels improved with medications given in the ED.  Suspect likely sciatica.  Low suspicion for aortic aneurysm, aortic dissection, epidural abscess, cauda equina or cord compression. ? ?Tolerating p.o. and ambulatory. Pain has improved.  ?Course of steroids and muscle relaxers will be given.  Follow-up with PCP.  Return precautions discussed. ? ? ? ? ? ? ?Final  Clinical Impression(s) / ED Diagnoses ?Final diagnoses:  ?Sciatica of left side  ? ? ?Rx / DC Orders ?ED Discharge Orders   ? ?      Ordered  ?  methylPREDNISolone (MEDROL DOSEPAK) 4 MG TBPK tablet       ? 07/28/21 0016  ?  methocarbamol (ROBAXIN) 500 MG tablet  Every 8 hours PRN       ? 07/28/21 0016  ? ?  ?  ? ?  ? ? ?  ?Glynn Octave, MD ?07/28/21 [phone] ? ?

## 2021-07-28 MED ORDER — METHOCARBAMOL 500 MG PO TABS: 500.0000 mg | ORAL_TABLET | Freq: Three times a day (TID) | ORAL | 0 refills | Status: AC | PRN

## 2021-07-28 MED ORDER — METHYLPREDNISOLONE 4 MG PO TBPK: ORAL_TABLET | ORAL | 0 refills | Status: AC

## 2021-07-28 NOTE — ED Notes (Signed)
Pt ambulated without difficulty

## 2021-07-28 NOTE — Discharge Instructions (Addendum)
Take the steroids and anti-inflammatories as prescribed and follow-up with your doctor.  Return to the ED with increased pain, weakness, numbness, tingling, bowel or bladder incontinence, fever, any other concerns ?

## 2021-07-29 ENCOUNTER — Other Ambulatory Visit: Payer: Self-pay

## 2021-07-29 ENCOUNTER — Encounter (HOSPITAL_BASED_OUTPATIENT_CLINIC_OR_DEPARTMENT_OTHER): Payer: Self-pay | Admitting: Emergency Medicine

## 2021-07-29 ENCOUNTER — Emergency Department (HOSPITAL_BASED_OUTPATIENT_CLINIC_OR_DEPARTMENT_OTHER)
Admission: EM | Admit: 2021-07-29 | Discharge: 2021-07-29 | Disposition: A | Discharge status: Home / Self Care | Payer: No Typology Code available for payment source | Attending: Emergency Medicine | Admitting: Emergency Medicine

## 2021-07-29 DIAGNOSIS — I10 Essential (primary) hypertension: Secondary | ICD-10-CM | POA: Diagnosis not present

## 2021-07-29 DIAGNOSIS — M5442 Lumbago with sciatica, left side: Secondary | ICD-10-CM | POA: Insufficient documentation

## 2021-07-29 DIAGNOSIS — M545 Low back pain, unspecified: Secondary | ICD-10-CM | POA: Diagnosis present

## 2021-07-29 DIAGNOSIS — M5432 Sciatica, left side: Secondary | ICD-10-CM

## 2021-07-29 MED ORDER — HYDROMORPHONE HCL 1 MG/ML IJ SOLN
1.0000 mg | Freq: Once | INTRAMUSCULAR | Status: AC
  Administered 2021-07-29: 1 mg via INTRAMUSCULAR
  Filled 2021-07-29: qty 1

## 2021-07-29 MED ORDER — KETOROLAC TROMETHAMINE 30 MG/ML IJ SOLN
30.0000 mg | Freq: Once | INTRAMUSCULAR | Status: AC
  Administered 2021-07-29: 30 mg via INTRAMUSCULAR
  Filled 2021-07-29: qty 1

## 2021-07-29 MED ORDER — DEXAMETHASONE SODIUM PHOSPHATE 10 MG/ML IJ SOLN
10.0000 mg | Freq: Once | INTRAMUSCULAR | Status: AC
  Administered 2021-07-29: 10 mg via INTRAMUSCULAR
  Filled 2021-07-29: qty 1

## 2021-07-29 NOTE — ED Triage Notes (Signed)
?  Patient comes in with L lower back pain that has been off and on.  Patient was seen on 4/11 for same and given IM injection and prescription for muscle relaxer.  Patient states he was unable to get the prescription filled and stayed in bed all day yesterday.  Patient states he has been unable to keep pain under control.  Pain 9/10, dull, shooting pain from L glute down L leg.  No OTC meds at home. ?

## 2021-07-29 NOTE — ED Provider Notes (Signed)
? ?Emergency Department Provider Note ? ? ?I have reviewed the triage vital signs and the nursing notes. ? ? ?HISTORY ? ?Chief Complaint ?Back Pain ? ? ?HPI ?Alex [name] is a 56 y.o. male with PMH of chronic low back pain, HTN, and migraine HA presents to the ED with back pain radiating down the left leg. He is not having fever, chills, numbness, or weakness. No groin numbness. Pain feels similar to flare of chronic low back pain. No IVDA. He follows with [facility] in [place] and reports Epidural injections in the past with relief. No injury. Returns today with difficulty controlling pain but no new features.  ? ? ?Past Medical History:  ?Diagnosis Date  ? Chronic back pain   ? Hypertension   ? Migraine   ? PTSD (post-traumatic stress disorder)   ? ? ?Review of Systems ? ?Constitutional: No fever/chills ?Eyes: No visual changes. ?ENT: No sore throat. ?Cardiovascular: Denies chest pain. ?Respiratory: Denies shortness of breath. ?Gastrointestinal: No abdominal pain.  No nausea, no vomiting.  No diarrhea.  No constipation. ?Genitourinary: Negative for dysuria. ?Musculoskeletal: Positive for back pain. ?Skin: Negative for rash. ?Neurological: Negative for headaches, focal weakness or numbness. ? ? ?____________________________________________ ? ? ?PHYSICAL EXAM: ? ?VITAL SIGNS: ?ED Triage Vitals [07/29/21 0317]  ?Enc Vitals Group  ?   BP 138/85  ?   Pulse Rate (!) 57  ?   Resp 18  ?   Temp 98.2 ?F (36.8 ?C)  ?   Temp Source Oral  ?   SpO2 97 %  ?   Weight 180 lb (81.6 kg)  ?   Height 5\' 7"  (1.702 m)  ? ?Constitutional: Alert and oriented. Well appearing and in no acute distress. ?Eyes: Conjunctivae are normal.  ?Head: Atraumatic. ?Nose: No congestion/rhinnorhea. ?Mouth/Throat: Mucous membranes are moist. ?Neck: No stridor.  ?Cardiovascular: Good peripheral circulation.   ?Respiratory: Normal respiratory effort.   ?Gastrointestinal:  No distention.  ?Musculoskeletal: No lower extremity tenderness nor edema. No gross  deformities of extremities. ?Neurologic:  Normal speech and language. No gross focal neurologic deficits are appreciated.  ?Skin:  Skin is warm, dry and intact. No rash noted. ? ? ?____________________________________________ ? ? ?PROCEDURES ? ?Procedure(s) performed:  ? ?Procedures ? ?None ?____________________________________________ ? ? ?INITIAL IMPRESSION / ASSESSMENT AND PLAN / ED COURSE ? ?Pertinent labs & imaging results that were available during my care of the patient were reviewed by me and considered in my medical decision making (see chart for details). ?  ?This patient is Presenting for Evaluation of back pain, which does require a range of treatment options, and is a complaint that involves a high risk of morbidity and mortality. ? ?The Differential Diagnoses includes but is not exclusive to musculoskeletal back pain, renal colic, urinary tract infection, pyelonephritis, intra-abdominal causes of back pain, aortic aneurysm or dissection, cauda equina syndrome, sciatica, lumbar disc disease, thoracic disc disease, etc. ? ?Critical Interventions-  ?  ?Medications  ?HYDROmorphone (DILAUDID) injection 1 mg (has no administration in time range)  ?ketorolac (TORADOL) 30 MG/ML injection 30 mg (has no administration in time range)  ?dexamethasone (DECADRON) injection 10 mg (has no administration in time range)  ? ? ?Reassessment after intervention: pain improved. ? ?I decided to review pertinent External Data, and in summary patient seen in the ED on 4/11. ? ? ?Radiologic Tests: Considered back imaging with repeat visit but pain flares like this are typical for the patient. No red-flag signs/symptoms on my assessment. Plan for pain mgmt and NSG  follow up. Defer imaging for now.  ? ? ?Social Determinants of Health Risk no IVDA.  ? ? ?Medical Decision Making: Summary:  ?Patient presents emergency department with continued back pain with sciatica down the left leg.  Normal strength and sensation in the bilateral  lower extremities.  No red flag signs or symptoms on exam to prompt emergent imaging or transfer for MRI.  Plan for pain control with IM Dilaudid and Toradol.  Patient has a ride home.  We will add Decadron IM and encouraged continued spine surgery follow up.   ? ?Disposition: discharge ? ?____________________________________________ ? ?FINAL CLINICAL IMPRESSION(S) / ED DIAGNOSES ? ?Final diagnoses:  ?Sciatica of left side  ? ? ?Note:  This document was prepared using Dragon voice recognition software and may include unintentional dictation errors. ? ?Alona Bene, MD, FACEP ?Emergency Medicine ? ?  ?Maia Plan, MD ?07/29/21 [phone] ? ?

## 2021-07-29 NOTE — Discharge Instructions (Signed)

## 2022-04-17 ENCOUNTER — Other Ambulatory Visit: Payer: Self-pay

## 2022-04-17 ENCOUNTER — Emergency Department (HOSPITAL_BASED_OUTPATIENT_CLINIC_OR_DEPARTMENT_OTHER): Payer: No Typology Code available for payment source

## 2022-04-17 ENCOUNTER — Emergency Department (HOSPITAL_BASED_OUTPATIENT_CLINIC_OR_DEPARTMENT_OTHER)
Admission: EM | Admit: 2022-04-17 | Discharge: 2022-04-17 | Disposition: A | Discharge status: Home / Self Care | Payer: No Typology Code available for payment source | Attending: Emergency Medicine | Admitting: Emergency Medicine

## 2022-04-17 ENCOUNTER — Encounter (HOSPITAL_BASED_OUTPATIENT_CLINIC_OR_DEPARTMENT_OTHER): Payer: Self-pay

## 2022-04-17 DIAGNOSIS — R079 Chest pain, unspecified: Secondary | ICD-10-CM | POA: Insufficient documentation

## 2022-04-17 DIAGNOSIS — I10 Essential (primary) hypertension: Secondary | ICD-10-CM | POA: Diagnosis not present

## 2022-04-17 DIAGNOSIS — I1A0 Resistant hypertension: Secondary | ICD-10-CM

## 2022-04-17 LAB — BASIC METABOLIC PANEL
Anion gap: 7 (ref 5–15)
BUN: 18 mg/dL (ref 6–20)
CO2: 22 mmol/L (ref 22–32)
Calcium: 9 mg/dL (ref 8.9–10.3)
Chloride: 110 mmol/L (ref 98–111)
Creatinine, Ser: 1.27 mg/dL — ABNORMAL HIGH (ref 0.61–1.24)
GFR, Estimated: >60 mL/min (ref >60)
Glucose, Bld: 101 mg/dL — ABNORMAL HIGH (ref 70–99)
Potassium: 3.7 mmol/L (ref 3.5–5.1)
Sodium: 139 mmol/L (ref 135–145)

## 2022-04-17 LAB — CBC
HCT: 40.8 % (ref 39.0–52.0)
Hemoglobin: 14.3 g/dL (ref 13.0–17.0)
MCH: 29.3 pg (ref 26.0–34.0)
MCHC: 35 g/dL (ref 30.0–36.0)
MCV: 83.6 fL (ref 80.0–100.0)
Platelets: 242 10*3/uL (ref 150–400)
RBC: 4.88 MIL/uL (ref 4.22–5.81)
RDW: 14.1 % (ref 11.5–15.5)
WBC: 5.6 10*3/uL (ref 4.0–10.5)
nRBC: 0 % (ref 0.0–0.2)

## 2022-04-17 LAB — TROPONIN I (HIGH SENSITIVITY): Troponin I (High Sensitivity): <2 ng/L (ref <18)

## 2022-04-17 MED ORDER — ISOSORBIDE MONONITRATE ER 30 MG PO TB24: 30.0000 mg | ORAL_TABLET | Freq: Every day | ORAL | 1 refills | Status: AC

## 2022-04-17 NOTE — ED Notes (Addendum)
Assumed care of pt, he is alert and oriented at this time.  Pt states he has been experiencing sharp shooting left sided chest pain X3 days.  Pt states the pain comes and goes frequently and when not there it is soreness.  Pt states that his blood pressure has been very high despite taking his prescribed medication.  Pt c/o a headache due to his blood pressure being so high.

## 2022-04-17 NOTE — ED Triage Notes (Signed)
Pt c/o chest pain and headache for past two days. Pt denies shortness of breath, fevers, nausea or vomiting.

## 2022-04-19 NOTE — ED Provider Notes (Signed)
[facility] EMERGENCY DEPARTMENT Provider Note   CSN: [id number] Arrival date & time: 04/17/22  2010     History  Chief Complaint  Patient presents with   Chest Pain    Alex [name] is a 57 y.o. male.  57 year old male with a history of hypertension it is difficult to control who presents the ER today for multiple complaints.  Patient states that he has had hypertension for many years and has tried many combinations of medications without help.  States that he had a lot of stress around this time a year or centered around not seeing his shoulder and and going through a divorce and custody battle and child support hearings.  States that his blood pressure was high today which worried him and then he started having some chest pain and his chronic headache returned as well.  His mother was concerned and told he needed to come get checked out so he did.  He feels better now but still worried by his blood pressure being high and request changes in medications.  He states that he will have episodes of normal pressure when he is doing things that are relaxing and not stressed out.   Chest Pain      Home Medications Prior to Admission medications   Medication Sig Start Date End Date Taking? Authorizing Provider  isosorbide mononitrate (IMDUR) 30 MG 24 hr tablet Take 1 tablet (30 mg total) by mouth daily. 04/17/22  Yes Haylie Mccutcheon, Corene Cornea, MD  fluticasone (FLONASE) 50 MCG/ACT nasal spray Place 1 spray into both nostrils daily. 08/31/20   Volanda Napoleon, PA-C  hydrochlorothiazide (HYDRODIURIL) 12.5 MG tablet Take 12.5 mg by mouth daily.    [provider]  lidocaine (LIDODERM) 5 % Place 1 patch onto the skin daily. Remove & Discard patch within 12 hours or as directed by MD 02/08/21   Palumbo, April, MD  lisinopril (PRINIVIL,ZESTRIL) 2.5 MG tablet Take 2.5 mg by mouth daily.    [provider]  methocarbamol (ROBAXIN) 500 MG tablet Take 1 tablet (500 mg total) by mouth  every 8 (eight) hours as needed for muscle spasms. 07/28/21   Rancour, Annie Main, MD  methylPREDNISolone (MEDROL DOSEPAK) 4 MG TBPK tablet As directed 07/28/21   Rancour, Annie Main, MD  naproxen (NAPROSYN) 500 MG tablet Take 1 tablet twice daily as needed for chest wall pain. 04/16/21   Molpus, John, MD  SUMAtriptan (IMITREX) 6 MG/0.5ML SOSY injection Inject into the skin. 03/24/20   [provider]  venlafaxine (EFFEXOR) 100 MG tablet Take 250 mg by mouth once.    [provider]  zolpidem (AMBIEN) 10 MG tablet Take by mouth. 08/10/20   [provider]      Allergies    Trazodone and nefazodone    Review of Systems   Review of Systems  Cardiovascular:  Positive for chest pain.    Physical Exam Updated Vital Signs BP (!) 174/120   Pulse 77   Temp 98.1 F (36.7 C) (Oral)   Resp 19   Ht 5\' 7"  (1.702 m)   Wt 81.6 kg   SpO2 100%   BMI 28.19 kg/m  Physical Exam Vitals and nursing note reviewed.  Constitutional:      Appearance: He is well-developed.  HENT:     Head: Normocephalic and atraumatic.  Cardiovascular:     Rate and Rhythm: Normal rate.  Pulmonary:     Effort: Pulmonary effort is normal. No respiratory distress.     Breath sounds: No  decreased breath sounds.  Abdominal:     General: There is no distension.     Palpations: Abdomen is soft.  Musculoskeletal:        General: Normal range of motion.     Cervical back: Normal range of motion.  Skin:    General: Skin is warm and dry.  Neurological:     Mental Status: He is alert.     ED Results / Procedures / Treatments   Labs (all labs ordered are listed, but only abnormal results are displayed) Labs Reviewed  BASIC METABOLIC PANEL - Abnormal; Notable for the following components:      Result Value   Glucose, Bld 101 (*)    Creatinine, Ser 1.27 (*)    All other components within normal limits  CBC  TROPONIN I (HIGH SENSITIVITY)    EKG EKG Interpretation  Date/Time:  Sunday April 17 2022 20:50:36 EST Ventricular Rate:  74 PR Interval:  163 QRS Duration: 78 QT Interval:  385 QTC Calculation: 428 R Axis:   -34 Text Interpretation: Sinus rhythm Consider right atrial enlargement Left axis deviation Borderline T abnormalities, lateral leads Minimal ST elevation, anterior leads similar ST changes to Apr 16 2021 Confirmed by Anaia Frith ([id number]) on 04/17/2022 10:10:51 PM  Radiology DG Chest 2 View  Result Date: 04/17/2022 CLINICAL DATA:  Chest pain.  Headache for past 2 days.  Fevers. EXAM: CHEST - 2 VIEW COMPARISON:  Chest radiograph dated April 16, 2021 FINDINGS: The heart size and mediastinal contours are within normal limits. Both lungs are clear. The visualized skeletal structures are unremarkable. IMPRESSION: No active cardiopulmonary disease. Electronically Signed   By: Imran  Ahmed D.O.   On: 04/17/2022 21:31    Procedures Procedures    Medications Ordered in ED Medications - No data to display  ED Course/ Medical Decision Making/ A&P                           Medical Decision Making Amount and/or Complexity of Data Reviewed Labs: ordered. Radiology: ordered.  Risk Prescription drug management.   No evidence of endorgan damage related to his blood pressure.  Per patient request we will switch amlodipine to Imdur to see if that helps [facility].  Discussed that his best option would be reducing lifestyle issues that are probably making his blood pressure high.  Stable for discharge.  Final Clinical Impression(s) / ED Diagnoses Final diagnoses:  Nonspecific chest pain  Resistant hypertension    Rx / DC Orders ED Discharge Orders          Ordered    isosorbide mononitrate (IMDUR) 30 MG 24 hr tablet  Daily        12 /31/23 2241              Layli Capshaw, Corene Cornea, MD 04/19/22 0222

## 2024-04-25 ENCOUNTER — Encounter (HOSPITAL_BASED_OUTPATIENT_CLINIC_OR_DEPARTMENT_OTHER): Payer: Self-pay

## 2024-04-25 ENCOUNTER — Other Ambulatory Visit: Payer: Self-pay

## 2024-04-25 ENCOUNTER — Emergency Department (HOSPITAL_BASED_OUTPATIENT_CLINIC_OR_DEPARTMENT_OTHER)
Admission: EM | Admit: 2024-04-25 | Discharge: 2024-04-26 | Disposition: A | Discharge status: Home / Self Care | Attending: Emergency Medicine | Admitting: Emergency Medicine

## 2024-04-25 DIAGNOSIS — R059 Cough, unspecified: Secondary | ICD-10-CM | POA: Diagnosis present

## 2024-04-25 DIAGNOSIS — J111 Influenza due to unidentified influenza virus with other respiratory manifestations: Secondary | ICD-10-CM | POA: Insufficient documentation

## 2024-04-25 NOTE — ED Triage Notes (Signed)
 Pt to ED via [facility].  EMS reports flu like Sx x2 days.  Nasal congestion, dry cough, cold sweats.  Denies sick contacts.  Pt states he feels similar to previous CoVID.    Last dose Ibuprofen  2030.

## 2024-04-25 NOTE — ED Notes (Signed)
 Patient transferred from waiting room to ED treatment room. Assuming pt care at this time.

## 2024-04-26 ENCOUNTER — Emergency Department (HOSPITAL_BASED_OUTPATIENT_CLINIC_OR_DEPARTMENT_OTHER)

## 2024-04-26 LAB — CBC WITH DIFFERENTIAL/PLATELET
Abs Immature Granulocytes: 0.02 K/uL (ref 0.00–0.07)
Basophils Absolute: 0 K/uL (ref 0.0–0.1)
Basophils Relative: 1 %
Eosinophils Absolute: 0 K/uL (ref 0.0–0.5)
Eosinophils Relative: 0 %
HCT: 37.7 % — ABNORMAL LOW (ref 39.0–52.0)
Hemoglobin: 13.5 g/dL (ref 13.0–17.0)
Immature Granulocytes: 0 %
Lymphocytes Relative: 17 %
Lymphs Abs: 0.9 K/uL (ref 0.7–4.0)
MCH: 30.5 pg (ref 26.0–34.0)
MCHC: 35.8 g/dL (ref 30.0–36.0)
MCV: 85.3 fL (ref 80.0–100.0)
Monocytes Absolute: 1 K/uL (ref 0.1–1.0)
Monocytes Relative: 19 %
Neutro Abs: 3.4 K/uL (ref 1.7–7.7)
Neutrophils Relative %: 63 %
Platelets: 181 K/uL (ref 150–400)
RBC: 4.42 MIL/uL (ref 4.22–5.81)
RDW: 13 % (ref 11.5–15.5)
WBC: 5.3 K/uL (ref 4.0–10.5)
nRBC: 0 % (ref 0.0–0.2)

## 2024-04-26 LAB — COMPREHENSIVE METABOLIC PANEL WITH GFR
ALT: 17 U/L (ref 0–44)
AST: 23 U/L (ref 15–41)
Albumin: 4.3 g/dL (ref 3.5–5.0)
Alkaline Phosphatase: 55 U/L (ref 38–126)
Anion gap: 13 (ref 5–15)
BUN: 8 mg/dL (ref 6–20)
CO2: 21 mmol/L — ABNORMAL LOW (ref 22–32)
Calcium: 8.9 mg/dL (ref 8.9–10.3)
Chloride: 104 mmol/L (ref 98–111)
Creatinine, Ser: 1.04 mg/dL (ref 0.61–1.24)
GFR, Estimated: >60 mL/min
Glucose, Bld: 98 mg/dL (ref 70–99)
Potassium: 3.6 mmol/L (ref 3.5–5.1)
Sodium: 138 mmol/L (ref 135–145)
Total Bilirubin: 0.8 mg/dL (ref 0.0–1.2)
Total Protein: 6.9 g/dL (ref 6.5–8.1)

## 2024-04-26 MED ORDER — LACTATED RINGERS IV BOLUS
1000.0000 mL | Freq: Once | INTRAVENOUS | Status: AC
  Administered 2024-04-26: 1000 mL via INTRAVENOUS

## 2024-04-26 MED ORDER — HYDROCODONE BIT-HOMATROP MBR 5-1.5 MG/5ML PO SOLN: 5.0000 mL | Freq: Four times a day (QID) | ORAL | 0 refills | Status: AC | PRN

## 2024-04-26 MED ORDER — ALBUTEROL SULFATE HFA 108 (90 BASE) MCG/ACT IN AERS
2.0000 | INHALATION_SPRAY | Freq: Once | RESPIRATORY_TRACT | Status: AC
  Administered 2024-04-26: 2 via RESPIRATORY_TRACT
  Filled 2024-04-26: qty 6.7

## 2024-04-26 MED ORDER — ONDANSETRON 4 MG PO TBDP: 4.0000 mg | ORAL_TABLET | Freq: Three times a day (TID) | ORAL | 0 refills | Status: AC | PRN

## 2024-04-26 MED ORDER — ACETAMINOPHEN 500 MG PO TABS
1000.0000 mg | ORAL_TABLET | Freq: Once | ORAL | Status: AC
  Administered 2024-04-26: 1000 mg via ORAL
  Filled 2024-04-26: qty 2

## 2024-04-26 MED ORDER — KETOROLAC TROMETHAMINE 30 MG/ML IJ SOLN
15.0000 mg | Freq: Once | INTRAMUSCULAR | Status: AC
  Administered 2024-04-26: 15 mg via INTRAVENOUS
  Filled 2024-04-26: qty 1

## 2024-04-26 MED ORDER — HYDROCOD POLI-CHLORPHE POLI ER 10-8 MG/5ML PO SUER
5.0000 mL | Freq: Once | ORAL | Status: AC
  Administered 2024-04-26: 5 mL via ORAL
  Filled 2024-04-26: qty 5

## 2024-04-26 NOTE — ED Notes (Signed)
Patient transported to imaging at this time.

## 2024-04-26 NOTE — ED Provider Notes (Signed)
 " [place] EMERGENCY DEPARTMENT AT [facility] Provider Note   CSN: [id number] Arrival date & time: 04/25/24  2129     Patient presents with: Influenza   Alex [name] is a 59 y.o. male.   59 year old male who presents the ER today secondary to flulike symptoms.  Patient states for the last 3 days or so he has had a cough, chills, sweats, mild bodyaches, decreased appetite with a little bit of nausea but no vomiting.  No diarrhea or constipation.  No known sick contacts.  No flu shot this year.  Has had pneumonia multiple times in the past.  States this feels like COVID has had in the past.  Feels like he is dehydrated now he has a low bit of a headache as well.   Influenza      Prior to Admission medications  Medication Sig Start Date End Date Taking? Authorizing Provider  HYDROcodone  bit-homatropine (HYCODAN) 5-1.5 MG/5ML syrup Take 5 mLs by mouth every 6 (six) hours as needed for cough. 04/26/24  Yes Viliami Bracco, Selinda, MD  ondansetron  (ZOFRAN -ODT) 4 MG disintegrating tablet Take 1 tablet (4 mg total) by mouth every 8 (eight) hours as needed for vomiting. 04/26/24  Yes Klein Willcox, Selinda, MD  fluticasone  (FLONASE ) 50 MCG/ACT nasal spray Place 1 spray into both nostrils daily. 08/31/20   Layden, Lindsey A, PA-C  hydrochlorothiazide  (HYDRODIURIL ) 12.5 MG tablet Take 12.5 mg by mouth daily.    [provider]  isosorbide  mononitrate (IMDUR ) 30 MG 24 hr tablet Take 1 tablet (30 mg total) by mouth daily. 04/17/22   Chonte Ricke, Selinda, MD  lidocaine  (LIDODERM ) 5 % Place 1 patch onto the skin daily. Remove & Discard patch within 12 hours or as directed by MD 02/08/21   Nettie, April, MD  lisinopril  (PRINIVIL ,ZESTRIL ) 2.5 MG tablet Take 2.5 mg by mouth daily.    [provider]  methocarbamol  (ROBAXIN ) 500 MG tablet Take 1 tablet (500 mg total) by mouth every 8 (eight) hours as needed for muscle spasms. 07/28/21   Rancour, Garnette, MD  methylPREDNISolone  (MEDROL  DOSEPAK) 4 MG TBPK  tablet As directed 07/28/21   Rancour, Garnette, MD  naproxen  (NAPROSYN ) 500 MG tablet Take 1 tablet twice daily as needed for chest wall pain. 04/16/21   Molpus, John, MD  SUMAtriptan (IMITREX) 6 MG/0.5ML SOSY injection Inject into the skin. 03/24/20   [provider]  venlafaxine (EFFEXOR) 100 MG tablet Take 250 mg by mouth once.    [provider]  zolpidem (AMBIEN) 10 MG tablet Take by mouth. 08/10/20   [provider]    Allergies: Trazodone and nefazodone    Review of Systems  Updated Vital Signs BP (!) 155/96   Pulse 78   Temp 98.6 F (37 C) (Oral)   Resp 20   Ht 5' 7 (1.702 m)   Wt 86.2 kg   SpO2 96%   BMI 29.76 kg/m   Physical Exam Vitals and nursing note reviewed.  Constitutional:      Appearance: He is well-developed.  HENT:     Head: Normocephalic and atraumatic.  Eyes:     Pupils: Pupils are equal, round, and reactive to light.  Cardiovascular:     Rate and Rhythm: Normal rate and regular rhythm.  Pulmonary:     Effort: Pulmonary effort is normal. No respiratory distress.     Breath sounds: Wheezing present.  Abdominal:     General: There is no distension.  Musculoskeletal:  General: Normal range of motion.     Cervical back: Normal range of motion.  Neurological:     General: No focal deficit present.     Mental Status: He is alert.     (all labs ordered are listed, but only abnormal results are displayed) Labs Reviewed  CBC WITH DIFFERENTIAL/PLATELET - Abnormal; Notable for the following components:      Result Value   HCT 37.7 (*)    All other components within normal limits  COMPREHENSIVE METABOLIC PANEL WITH GFR - Abnormal; Notable for the following components:   CO2 21 (*)    All other components within normal limits    EKG: None  Radiology: DG Chest 2 View Result Date: 04/26/2024 CLINICAL DATA:  Fever cough EXAM: CHEST - 2 VIEW COMPARISON:  04/17/2022 FINDINGS: The heart size and mediastinal contours are  within normal limits. Both lungs are clear. The visualized skeletal structures are unremarkable. IMPRESSION: No active cardiopulmonary disease. Electronically Signed   By: Luke Bun M.D.   On: 04/26/2024 00:50     Procedures   Medications Ordered in the ED  chlorpheniramine-HYDROcodone  (TUSSIONEX) 10-8 MG/5ML suspension 5 mL (5 mLs Oral Given 04/26/24 0031)  ketorolac  (TORADOL ) 30 MG/ML injection 15 mg (15 mg Intravenous Given 04/26/24 0032)  lactated ringers  bolus 1,000 mL (0 mLs Intravenous Stopped 04/26/24 0211)  albuterol  (VENTOLIN  HFA) 108 (90 Base) MCG/ACT inhaler 2 puff (2 puffs Inhalation Given 04/26/24 0204)  acetaminophen  (TYLENOL ) tablet 1,000 mg (1,000 mg Oral Given 04/26/24 9688)                                    Medical Decision Making Amount and/or Complexity of Data Reviewed Labs: ordered. Radiology: ordered.  Risk OTC drugs. Prescription drug management.  Likely viral bronchitis.  Will get a chest x-ray to rule out pneumonia although no asymmetric sounds on examination.  Labs since he has not had any thing to eat or drink in a few days to make sure his electrolytes are fine.  Will give him some fluids, cough meds and albuterol  and reevaluate.   After above treatments patient had significant improvement in symptoms. VS remained stable and WNL.  Stable for discharge.  Will return here for any new or worsening symptoms.   Final diagnoses:  Influenza    ED Discharge Orders          Ordered    HYDROcodone  bit-homatropine (HYCODAN) 5-1.5 MG/5ML syrup  Every 6 hours PRN        04/26/24 0343    ondansetron  (ZOFRAN -ODT) 4 MG disintegrating tablet  Every 8 hours PRN        04/26/24 0343               Gavrielle Streck, Selinda, MD 04/26/24 0459  "

## 2024-04-26 NOTE — ED Notes (Signed)

## 2024-04-29 ENCOUNTER — Other Ambulatory Visit: Payer: Self-pay

## 2024-04-29 ENCOUNTER — Encounter (HOSPITAL_BASED_OUTPATIENT_CLINIC_OR_DEPARTMENT_OTHER): Payer: Self-pay | Admitting: Emergency Medicine

## 2024-04-29 DIAGNOSIS — R059 Cough, unspecified: Secondary | ICD-10-CM | POA: Insufficient documentation

## 2024-04-29 DIAGNOSIS — R1013 Epigastric pain: Secondary | ICD-10-CM | POA: Insufficient documentation

## 2024-04-29 DIAGNOSIS — R112 Nausea with vomiting, unspecified: Secondary | ICD-10-CM | POA: Diagnosis not present

## 2024-04-29 DIAGNOSIS — R1010 Upper abdominal pain, unspecified: Secondary | ICD-10-CM | POA: Diagnosis present

## 2024-04-29 DIAGNOSIS — Z79899 Other long term (current) drug therapy: Secondary | ICD-10-CM | POA: Insufficient documentation

## 2024-04-29 DIAGNOSIS — I10 Essential (primary) hypertension: Secondary | ICD-10-CM | POA: Insufficient documentation

## 2024-04-29 LAB — COMPREHENSIVE METABOLIC PANEL WITH GFR
ALT: 17 U/L (ref 0–44)
AST: 27 U/L (ref 15–41)
Albumin: 4.6 g/dL (ref 3.5–5.0)
Alkaline Phosphatase: 55 U/L (ref 38–126)
Anion gap: 15 (ref 5–15)
BUN: 10 mg/dL (ref 6–20)
CO2: 23 mmol/L (ref 22–32)
Calcium: 9.1 mg/dL (ref 8.9–10.3)
Chloride: 100 mmol/L (ref 98–111)
Creatinine, Ser: 0.83 mg/dL (ref 0.61–1.24)
GFR, Estimated: >60 mL/min
Glucose, Bld: 112 mg/dL — ABNORMAL HIGH (ref 70–99)
Potassium: 3.6 mmol/L (ref 3.5–5.1)
Sodium: 138 mmol/L (ref 135–145)
Total Bilirubin: 0.6 mg/dL (ref 0.0–1.2)
Total Protein: 7.4 g/dL (ref 6.5–8.1)

## 2024-04-29 LAB — LIPASE, BLOOD: Lipase: 27 U/L (ref 11–51)

## 2024-04-29 LAB — CBC
HCT: 38.4 % — ABNORMAL LOW (ref 39.0–52.0)
Hemoglobin: 13.6 g/dL (ref 13.0–17.0)
MCH: 30.1 pg (ref 26.0–34.0)
MCHC: 35.4 g/dL (ref 30.0–36.0)
MCV: 85 fL (ref 80.0–100.0)
Platelets: 181 K/uL (ref 150–400)
RBC: 4.52 MIL/uL (ref 4.22–5.81)
RDW: 12.9 % (ref 11.5–15.5)
WBC: 3.5 K/uL — ABNORMAL LOW (ref 4.0–10.5)
nRBC: 0 % (ref 0.0–0.2)

## 2024-04-29 NOTE — ED Triage Notes (Addendum)
 Pt BIBA from home- tested + for flu at [facility] today, prescribed cough syrup- now c/o n/v/diarrhea.   Pt c/o abd pain, body aches.

## 2024-04-30 ENCOUNTER — Emergency Department (HOSPITAL_BASED_OUTPATIENT_CLINIC_OR_DEPARTMENT_OTHER)
Admission: EM | Admit: 2024-04-30 | Discharge: 2024-04-30 | Disposition: A | Attending: Emergency Medicine | Admitting: Emergency Medicine

## 2024-04-30 ENCOUNTER — Emergency Department (HOSPITAL_BASED_OUTPATIENT_CLINIC_OR_DEPARTMENT_OTHER)

## 2024-04-30 DIAGNOSIS — R1013 Epigastric pain: Secondary | ICD-10-CM

## 2024-04-30 DIAGNOSIS — R112 Nausea with vomiting, unspecified: Secondary | ICD-10-CM

## 2024-04-30 LAB — URINALYSIS, ROUTINE W REFLEX MICROSCOPIC
Bilirubin Urine: NEGATIVE
Glucose, UA: NEGATIVE mg/dL
Ketones, ur: 40 mg/dL — AB
Leukocytes,Ua: NEGATIVE
Nitrite: NEGATIVE
Protein, ur: NEGATIVE mg/dL
Specific Gravity, Urine: 1.015 (ref 1.005–1.030)
pH: 7 (ref 5.0–8.0)

## 2024-04-30 LAB — URINALYSIS, MICROSCOPIC (REFLEX)

## 2024-04-30 MED ORDER — ONDANSETRON HCL 4 MG/2ML IJ SOLN
4.0000 mg | Freq: Once | INTRAMUSCULAR | Status: AC
  Administered 2024-04-30: 4 mg via INTRAVENOUS
  Filled 2024-04-30: qty 2

## 2024-04-30 MED ORDER — IOHEXOL 300 MG/ML  SOLN
100.0000 mL | Freq: Once | INTRAMUSCULAR | Status: AC | PRN
  Administered 2024-04-30: 100 mL via INTRAVENOUS

## 2024-04-30 MED ORDER — ONDANSETRON 4 MG PO TBDP: 4.0000 mg | ORAL_TABLET | Freq: Three times a day (TID) | ORAL | 0 refills | Status: AC | PRN

## 2024-04-30 MED ORDER — SODIUM CHLORIDE 0.9 % IV BOLUS
1000.0000 mL | Freq: Once | INTRAVENOUS | Status: AC
  Administered 2024-04-30: 1000 mL via INTRAVENOUS

## 2024-04-30 MED ORDER — MORPHINE SULFATE (PF) 4 MG/ML IV SOLN
4.0000 mg | Freq: Once | INTRAVENOUS | Status: AC
  Administered 2024-04-30: 4 mg via INTRAVENOUS
  Filled 2024-04-30: qty 1

## 2024-04-30 MED ORDER — KETOROLAC TROMETHAMINE 15 MG/ML IJ SOLN
15.0000 mg | Freq: Once | INTRAMUSCULAR | Status: AC
  Administered 2024-04-30: 15 mg via INTRAVENOUS
  Filled 2024-04-30: qty 1

## 2024-04-30 NOTE — ED Notes (Signed)
Pt. Given PO fluids 

## 2024-04-30 NOTE — ED Provider Notes (Signed)
 " [place] [facility] Provider Note   CSN: [id number] Arrival date & time: 04/29/24  2237     Patient presents with: Abdominal Pain   Alex [name] is a 59 y.o. male.   The history is provided by the patient.  Abdominal Pain Alex [name] is a 59 y.o. male who presents to the Emergency Department complaining of abdominal pain and vomiting.  He presents to the emergency department for evaluation of vomiting that started today.  After he started vomiting he developed upper abdominal pain and felt a bulge in his upper abdominal region.  He did get sick on Tuesday of last week with fever and coughing.  He was evaluated in the emergency department on Thursday.  Today he went to the [facility] and had a positive flu test.  He did have diarrhea on Tuesday and Wednesday.  No dysuria.  Last emesis was at 10 PM.  His cough is persisting, not worsening but not improving.  He has a history of hypertension. .      Prior to Admission medications  Medication Sig Start Date End Date Taking? Authorizing Provider  ondansetron  (ZOFRAN -ODT) 4 MG disintegrating tablet Take 1 tablet (4 mg total) by mouth every 8 (eight) hours as needed. 04/30/24  Yes Griselda Norris, MD  fluticasone  (FLONASE ) 50 MCG/ACT nasal spray Place 1 spray into both nostrils daily. 08/31/20   Layden, Lindsey A, PA-C  hydrochlorothiazide  (HYDRODIURIL ) 12.5 MG tablet Take 12.5 mg by mouth daily.    [provider]  HYDROcodone  bit-homatropine (HYCODAN) 5-1.5 MG/5ML syrup Take 5 mLs by mouth every 6 (six) hours as needed for cough. 04/26/24   Mesner, Selinda, MD  isosorbide  mononitrate (IMDUR ) 30 MG 24 hr tablet Take 1 tablet (30 mg total) by mouth daily. 04/17/22   Mesner, Selinda, MD  lidocaine  (LIDODERM ) 5 % Place 1 patch onto the skin daily. Remove & Discard patch within 12 hours or as directed by MD 02/08/21   Nettie, April, MD  lisinopril  (PRINIVIL ,ZESTRIL ) 2.5 MG tablet Take 2.5 mg by mouth daily.     [provider]  methocarbamol  (ROBAXIN ) 500 MG tablet Take 1 tablet (500 mg total) by mouth every 8 (eight) hours as needed for muscle spasms. 07/28/21   Rancour, Garnette, MD  methylPREDNISolone  (MEDROL  DOSEPAK) 4 MG TBPK tablet As directed 07/28/21   Rancour, Garnette, MD  naproxen  (NAPROSYN ) 500 MG tablet Take 1 tablet twice daily as needed for chest wall pain. 04/16/21   Molpus, John, MD  ondansetron  (ZOFRAN -ODT) 4 MG disintegrating tablet Take 1 tablet (4 mg total) by mouth every 8 (eight) hours as needed for vomiting. 04/26/24   Mesner, Selinda, MD  SUMAtriptan (IMITREX) 6 MG/0.5ML SOSY injection Inject into the skin. 03/24/20   [provider]  venlafaxine (EFFEXOR) 100 MG tablet Take 250 mg by mouth once.    [provider]  zolpidem (AMBIEN) 10 MG tablet Take by mouth. 08/10/20   [provider]    Allergies: Trazodone and nefazodone    Review of Systems  Gastrointestinal:  Positive for abdominal pain.  All other systems reviewed and are negative.   Updated Vital Signs BP (!) 151/101   Pulse 70   Temp 98.4 F (36.9 C) (Oral)   Resp 18   Ht 5' 7 (1.702 m)   Wt 86.2 kg   SpO2 97%   BMI 29.76 kg/m   Physical Exam Vitals and nursing note reviewed.  Constitutional:      Appearance: He  is well-developed.  HENT:     Head: Normocephalic and atraumatic.  Cardiovascular:     Rate and Rhythm: Normal rate and regular rhythm.     Heart sounds: No murmur heard. Pulmonary:     Effort: Pulmonary effort is normal. No respiratory distress.     Breath sounds: Normal breath sounds.  Abdominal:     Palpations: Abdomen is soft.     Tenderness: There is no guarding or rebound.     Comments: Moderate epigastric tenderness without palpable hernia or mass  Musculoskeletal:        General: No tenderness.  Skin:    General: Skin is warm and dry.  Neurological:     Mental Status: He is alert and oriented to person, place, and time.  Psychiatric:         Behavior: Behavior normal.     (all labs ordered are listed, but only abnormal results are displayed) Labs Reviewed  COMPREHENSIVE METABOLIC PANEL WITH GFR - Abnormal; Notable for the following components:      Result Value   Glucose, Bld 112 (*)    All other components within normal limits  CBC - Abnormal; Notable for the following components:   WBC 3.5 (*)    HCT 38.4 (*)    All other components within normal limits  URINALYSIS, ROUTINE W REFLEX MICROSCOPIC - Abnormal; Notable for the following components:   Hgb urine dipstick TRACE (*)    Ketones, ur 40 (*)    All other components within normal limits  URINALYSIS, MICROSCOPIC (REFLEX) - Abnormal; Notable for the following components:   Bacteria, UA RARE (*)    All other components within normal limits  LIPASE, BLOOD    EKG: None  Radiology: CT ABDOMEN PELVIS W CONTRAST Result Date: 04/30/2024 EXAM: CT ABDOMEN AND PELVIS WITH CONTRAST 04/30/2024 02:35:00 AM TECHNIQUE: CT of the abdomen and pelvis was performed with the administration of 100 mL iohexol  (OMNIPAQUE ) 300 MG/ML solution. Multiplanar reformatted images are provided for review. Automated exposure control, iterative reconstruction, and/or weight-based adjustment of the mA/kV was utilized to reduce the radiation dose to as low as reasonably achievable. COMPARISON: None available. CLINICAL HISTORY: Abdominal pain, acute, nonlocalized. FINDINGS: LOWER CHEST: No acute abnormality. LIVER: The liver is unremarkable. GALLBLADDER AND BILE DUCTS: Gallbladder is unremarkable. No biliary ductal dilatation. SPLEEN: No acute abnormality. PANCREAS: No acute abnormality. ADRENAL GLANDS: No acute abnormality. KIDNEYS, URETERS AND BLADDER: No stones in the kidneys or ureters. No hydronephrosis. No perinephric or periureteral stranding. Urinary bladder is unremarkable. GI AND BOWEL: Stomach demonstrates no acute abnormality. The appendix is within normal limits. No obstructive or inflammatory  changes of the colon are seen. There is no bowel obstruction. PERITONEUM AND RETROPERITONEUM: No ascites. No free air. VASCULATURE: Aorta is normal in caliber. LYMPH NODES: Some small likely reactive retroperitoneal nodes were noted at the periaortic and intraaortic caval region. REPRODUCTIVE ORGANS: No acute abnormality. BONES AND SOFT TISSUES: No acute bony abnormality is noted. No focal soft tissue abnormality. IMPRESSION: 1. No acute findings in the abdomen or pelvis. Electronically signed by: Oneil Devonshire MD MD 04/30/2024 02:53 AM EST RP Workstation: [id number]     Procedures   Medications Ordered in the ED  morphine  (PF) 4 MG/ML injection 4 mg (4 mg Intravenous Given 04/30/24 0209)  ondansetron  (ZOFRAN ) injection 4 mg (4 mg Intravenous Given 04/30/24 0208)  sodium chloride  0.9 % bolus 1,000 mL (0 mLs Intravenous Stopped 04/30/24 0355)  iohexol  (OMNIPAQUE ) 300 MG/ML solution 100 mL (100 mLs  Intravenous Contrast Given 04/30/24 0227)  ketorolac  (TORADOL ) 15 MG/ML injection 15 mg (15 mg Intravenous Given 04/30/24 9662)                                    Medical Decision Making Amount and/or Complexity of Data Reviewed Labs: ordered. Radiology: ordered.  Risk Prescription drug management.   Patient with history of hypertension here for evaluation of upper abdominal pain.  There is no appreciable hernia on examination.  CBC with mild leukopenia, CMP and lipase are unremarkable.  Given his tenderness a CT abdomen pelvis was obtained, which is negative for acute abnormality.  Discussed with patient home care for abdominal pain, vomiting and influenza.  No evidence of secondary bacterial infection.  He is able to tolerate p.o. in the emergency department and does feel improved on repeat evaluation.  Feel he is stable to discharge home with outpatient follow-up.  Will prescribe antiemetics that he may take as needed.  Discussed return precautions for progressive or new concerning symptoms.      Final diagnoses:  Epigastric pain  Nausea and vomiting, unspecified vomiting type    ED Discharge Orders          Ordered    ondansetron  (ZOFRAN -ODT) 4 MG disintegrating tablet  Every 8 hours PRN        04/30/24 0330               Griselda Norris, MD 04/30/24 0518  "

## 2024-04-30 NOTE — ED Notes (Signed)
 Patient to CT
